# Patient Record
Sex: Female | Born: 1965 | Race: White | Hispanic: No | Smoking: Former smoker
Health system: Southern US, Community
[De-identification: ages and names within clinical notes are randomized; demographics above are authoritative.]

## PROBLEM LIST (undated history)

## (undated) DIAGNOSIS — E782 Mixed hyperlipidemia: Secondary | ICD-10-CM

## (undated) DIAGNOSIS — R350 Frequency of micturition: Secondary | ICD-10-CM

## (undated) DIAGNOSIS — F419 Anxiety disorder, unspecified: Secondary | ICD-10-CM

## (undated) DIAGNOSIS — M1990 Unspecified osteoarthritis, unspecified site: Secondary | ICD-10-CM

## (undated) DIAGNOSIS — E7800 Pure hypercholesterolemia, unspecified: Secondary | ICD-10-CM

## (undated) DIAGNOSIS — K8020 Calculus of gallbladder without cholecystitis without obstruction: Secondary | ICD-10-CM

## (undated) DIAGNOSIS — N390 Urinary tract infection, site not specified: Secondary | ICD-10-CM

## (undated) DIAGNOSIS — M8580 Other specified disorders of bone density and structure, unspecified site: Secondary | ICD-10-CM

## (undated) DIAGNOSIS — E559 Vitamin D deficiency, unspecified: Secondary | ICD-10-CM

## (undated) DIAGNOSIS — K219 Gastro-esophageal reflux disease without esophagitis: Secondary | ICD-10-CM

## (undated) DIAGNOSIS — Z973 Presence of spectacles and contact lenses: Secondary | ICD-10-CM

## (undated) DIAGNOSIS — J189 Pneumonia, unspecified organism: Secondary | ICD-10-CM

## (undated) HISTORY — PX: BREAST ENHANCEMENT SURGERY: SHX7

## (undated) HISTORY — PX: OTHER SURGICAL HISTORY: SHX169

## (undated) HISTORY — PX: CERVICAL ABLATION: SHX5771

## (undated) HISTORY — PX: AUGMENTATION MAMMAPLASTY: SUR837

---

## 2017-05-23 ENCOUNTER — Other Ambulatory Visit: Payer: Self-pay | Admitting: Surgery

## 2017-06-02 NOTE — Progress Notes (Signed)
Anesthesia: SAME DAY WORK-UP.   Patient is a 53 year old female scheduled for laparoscopic cholecystectomy on 06/05/17 by Dr. Coralie Keens. Patient lives 90 minutes away and did not want to come in to PAT. She faxed her last PCP office note from 04/04/17 with Dr. Kyung Rudd at [facility]. History listed includes tobacco user, chronic cholecystitis, mixed HLD, osteopenia, situational anxiety, vitamin D deficiency, recurrent UTI, breast augmentation. Dr. Jeanie Sewer did not recommend statin therapy based on recent cholesterol panel (cardiac risk of 1.6%.) Med list includes Bentyl, Valium PRN.  Per [facility] triage nurse Abigail Butts, Dr. Ninfa Linden did not recommend any specific preoperative labs and would defer to anesthesia. Based on currently available history, she would need a preoperative hemoglobin. One of our PAT RN will complete a phone interview with patient prior to surgery, and I have been asked them to let anesthesia APP know if any pertinent new history of symptoms reported.   George [facility] Short Stay Center/Anesthesiology Phone [phone] 06/02/2017 3:41 PM

## 2017-06-04 ENCOUNTER — Other Ambulatory Visit: Payer: Self-pay

## 2017-06-04 ENCOUNTER — Encounter (HOSPITAL_COMMUNITY): Payer: Self-pay | Admitting: *Deleted

## 2017-06-04 NOTE — H&P (Signed)
Margaret Finley Documented: 05/23/2017 10:02 AM Location: [facility] Patient #: [id number] DOB: [birth year] Divorced / Language: Undefined / Race: Refused to Report/Unreported Female   History of Present Illness (Margaret Finley A. Ninfa Linden MD; 05/23/2017 10:21 AM) The patient is a 52 year old female who presents for evaluation of gall stones. This patient is a self-referral. She has known symptomatic cholelithiasis. She has been having attacks of right upper quadrant abdominal pain with occasional nausea and vomiting for the last 5 years. Her attacks now are becoming more frequent. She's had no jaundice. Bowel movements are normal. The pain is described as sharp and moderate in the right upper quadrant. It occurs after fatty meals. She is otherwise healthy without complaints.   Past Surgical History (Tanisha A. Owens Shark, Lupus; 05/23/2017 10:02 AM) Breast Augmentation  Bilateral.  Diagnostic Studies History (Tanisha A. Owens Shark, [place]; 05/23/2017 10:02 AM) Colonoscopy  5-10 years ago Mammogram  within last year Pap Smear  1-5 years ago  Allergies (Tanisha A. Owens Shark, [place]; 05/23/2017 10:05 AM) Sulfa Antibiotics  Allergies Reconciled   Medication History (Tanisha A. Owens Shark, RMA; 05/23/2017 10:05 AM) DiazePAM (10MG  Tablet, Oral) Active. Ciprofloxacin HCl (500MG  Tablet, Oral) Active. Dicyclomine HCl (20MG  Tablet, Oral) Active. Medications Reconciled  Social History (Tanisha A. Owens Shark, [place]; 05/23/2017 10:02 AM) Alcohol use  Occasional alcohol use. Caffeine use  Carbonated beverages, Coffee. Illicit drug use  Remotely quit drug use. Tobacco use  Former smoker.  Family History (Tanisha A. Owens Shark, [place]; 05/23/2017 10:02 AM) Alcohol Abuse  Father. Arthritis  Father. Diabetes Mellitus  Father. Heart Disease  Father. Heart disease in female family member before age 70  Melanoma  Father.  Pregnancy / Birth History (Tanisha A. Owens Shark, [place]; 05/23/2017 10:02 AM) Age at menarche  49  years. Contraceptive History  Contraceptive implant. Gravida  3 Irregular periods  Length (months) of breastfeeding  3-6 Maternal age  73-25 Para  2  Other Problems (Tanisha A. Owens Shark, [place]; 05/23/2017 10:02 AM) Arthritis  Chest pain  Cholelithiasis  Gastroesophageal Reflux Disease  Hypercholesterolemia  Lump In Breast     Review of Systems (Tanisha A. Brown RMA; 05/23/2017 10:02 AM) General Present- Appetite Loss and Night Sweats. Not Present- Chills, Fatigue, Fever, Weight Gain and Weight Loss. Skin Not Present- Change in Wart/Mole, Dryness, Hives, Jaundice, New Lesions, Non-Healing Wounds, Rash and Ulcer. HEENT Present- Seasonal Allergies and Wears glasses/contact lenses. Not Present- Earache, Hearing Loss, Hoarseness, Nose Bleed, Oral Ulcers, Ringing in the Ears, Sinus Pain, Sore Throat, Visual Disturbances and Yellow Eyes. Respiratory Not Present- Bloody sputum, Chronic Cough, Difficulty Breathing, Snoring and Wheezing. Breast Present- Breast Pain. Not Present- Breast Mass, Nipple Discharge and Skin Changes. Cardiovascular Present- Leg Cramps. Not Present- Chest Pain, Difficulty Breathing Lying Down, Palpitations, Rapid Heart Rate, Shortness of Breath and Swelling of Extremities. Gastrointestinal Present- Abdominal Pain, Nausea and Vomiting. Not Present- Bloating, Bloody Stool, Change in Bowel Habits, Chronic diarrhea, Constipation, Difficulty Swallowing, Excessive gas, Gets full quickly at meals, Hemorrhoids, Indigestion and Rectal Pain. Female Genitourinary Present- Frequency, Nocturia and Urgency. Not Present- Painful Urination and Pelvic Pain. Musculoskeletal Present- Joint Pain and Joint Stiffness. Not Present- Back Pain, Muscle Pain, Muscle Weakness and Swelling of Extremities. Neurological Not Present- Decreased Memory, Fainting, Headaches, Numbness, Seizures, Tingling, Tremor, Trouble walking and Weakness. Psychiatric Not Present- Anxiety, Bipolar, Change in Sleep  Pattern, Depression, Fearful and Frequent crying. Endocrine Present- Hot flashes. Not Present- Cold Intolerance, Excessive Hunger, Hair Changes, Heat Intolerance and New Diabetes. Hematology Not Present- Blood Thinners, Easy Bruising, Excessive  bleeding, Gland problems, HIV and Persistent Infections.  Vitals (Tanisha A. Brown RMA; 05/23/2017 10:04 AM) 05/23/2017 10:03 AM Weight: 142 lb Height: 65in Body Surface Area: 1.71 m Body Mass Index: 23.63 kg/m  Temp.: 98.12F  Pulse: 74 (Regular)  BP: 126/88 (Sitting, Left Arm, Standard)       Physical Exam (Wrenna Saks A. Ninfa Linden MD; 05/23/2017 10:21 AM) The physical exam findings are as follows: Note:Today she is well and comfortable in appearance There is no jaundice Lungs are clear bilaterally Cardiovascular regular rate and rhythm Abdomen is soft and nontender    Assessment & Plan (Jonnae Fonseca A. Ninfa Linden MD; 05/23/2017 10:22 AM) SYMPTOMATIC CHOLELITHIASIS (K80.20) Impression: I have reviewed her ultrasound showing cholelithiasis. The bile ducts is normal in size. We discussed cholecystectomy in detail. I gave him literature regarding surgery. We discussed the risk of surgery which includes but is not limited to bleeding, infection, bile duct injury, bile leak, the need to convert to an open procedure, cardiopulmonary issues, postoperative recovery, etc. She understands and wished to proceed with surgery which will be scheduled

## 2017-06-04 NOTE — Anesthesia Preprocedure Evaluation (Addendum)
Anesthesia Evaluation  Patient identified by MRN, date of birth, ID band Patient awake    Reviewed: Allergy & Precautions, H&P , NPO status , Patient's Chart, lab work & pertinent test results  Airway Mallampati: I  TM Distance: >3 FB Neck ROM: Full    Dental no notable dental hx. (+) Teeth Intact, Dental Advisory Given   Pulmonary neg pulmonary ROS, former smoker,    Pulmonary exam normal breath sounds clear to auscultation       Cardiovascular Exercise Tolerance: Good negative cardio ROS   Rhythm:Regular Rate:Normal     Neuro/Psych Anxiety negative neurological ROS  negative psych ROS   GI/Hepatic negative GI ROS, Neg liver ROS,   Endo/Other  negative endocrine ROS  Renal/GU negative Renal ROS  negative genitourinary   Musculoskeletal  (+) Arthritis , Osteoarthritis,    Abdominal   Peds  Hematology negative hematology ROS (+)   Anesthesia Other Findings   Reproductive/Obstetrics negative OB ROS                            Anesthesia Physical Anesthesia Plan  ASA: II  Anesthesia Plan: General   Post-op Pain Management:    Induction: Intravenous  PONV Risk Score and Plan: 4 or greater and Ondansetron, Dexamethasone and Midazolam  Airway Management Planned: Oral ETT  Additional Equipment:   Intra-op Plan:   Post-operative Plan: Extubation in OR  Informed Consent: I have reviewed the patients History and Physical, chart, labs and discussed the procedure including the risks, benefits and alternatives for the proposed anesthesia with the patient or authorized representative who has indicated his/her understanding and acceptance.   Dental advisory given  Plan Discussed with: CRNA  Anesthesia Plan Comments:         Anesthesia Quick Evaluation

## 2017-06-04 NOTE — Progress Notes (Signed)
Pt denies SOB, chest pain, and being under the care of a cardiologist. Pt denies having an  echo and cardiac cath but stated that a stress test was performed 25 years ago. Pt denies having an EKG and chest x ray within the last year. Pt denies recent labs. Pt made aware to stop taking Aspirin, vitamins, fish oil, Red Yeast Rice and herbal medications. Do not take any NSAIDs ie: Ibuprofen, Advil, Naproxen (Aleve), Motrin, BC and Goody Powder. Pt verbalized understanding of all pre-op instructions. See anesthesia note.

## 2017-06-05 ENCOUNTER — Encounter (HOSPITAL_COMMUNITY): Payer: Self-pay | Admitting: Certified Registered Nurse Anesthetist

## 2017-06-05 ENCOUNTER — Ambulatory Visit (HOSPITAL_COMMUNITY): Payer: PRIVATE HEALTH INSURANCE | Admitting: Vascular Surgery

## 2017-06-05 ENCOUNTER — Encounter (HOSPITAL_COMMUNITY)
Admission: RE | Disposition: A | Discharge status: Home / Self Care | Payer: Self-pay | Source: Ambulatory Visit | Attending: Surgery

## 2017-06-05 ENCOUNTER — Ambulatory Visit (HOSPITAL_COMMUNITY)
Admission: RE | Admit: 2017-06-05 | Discharge: 2017-06-05 | Disposition: A | Discharge status: Home / Self Care | Payer: PRIVATE HEALTH INSURANCE | Source: Ambulatory Visit | Attending: Surgery | Admitting: Surgery

## 2017-06-05 DIAGNOSIS — K8010 Calculus of gallbladder with chronic cholecystitis without obstruction: Secondary | ICD-10-CM | POA: Insufficient documentation

## 2017-06-05 DIAGNOSIS — D171 Benign lipomatous neoplasm of skin and subcutaneous tissue of trunk: Secondary | ICD-10-CM | POA: Diagnosis not present

## 2017-06-05 DIAGNOSIS — Z87891 Personal history of nicotine dependence: Secondary | ICD-10-CM | POA: Diagnosis not present

## 2017-06-05 DIAGNOSIS — Z79899 Other long term (current) drug therapy: Secondary | ICD-10-CM | POA: Diagnosis not present

## 2017-06-05 DIAGNOSIS — K8020 Calculus of gallbladder without cholecystitis without obstruction: Secondary | ICD-10-CM | POA: Diagnosis present

## 2017-06-05 HISTORY — DX: Calculus of gallbladder without cholecystitis without obstruction: K80.20

## 2017-06-05 HISTORY — DX: Urinary tract infection, site not specified: N39.0

## 2017-06-05 HISTORY — PX: LIPOMA EXCISION: SHX5283

## 2017-06-05 HISTORY — DX: Gastro-esophageal reflux disease without esophagitis: K21.9

## 2017-06-05 HISTORY — DX: Vitamin D deficiency, unspecified: E55.9

## 2017-06-05 HISTORY — PX: CHOLECYSTECTOMY: SHX55

## 2017-06-05 HISTORY — DX: Pure hypercholesterolemia, unspecified: E78.00

## 2017-06-05 HISTORY — DX: Presence of spectacles and contact lenses: Z97.3

## 2017-06-05 HISTORY — DX: Unspecified osteoarthritis, unspecified site: M19.90

## 2017-06-05 HISTORY — DX: Other specified disorders of bone density and structure, unspecified site: M85.80

## 2017-06-05 HISTORY — DX: Pneumonia, unspecified organism: J18.9

## 2017-06-05 HISTORY — DX: Anxiety disorder, unspecified: F41.9

## 2017-06-05 HISTORY — DX: Frequency of micturition: R35.0

## 2017-06-05 HISTORY — DX: Mixed hyperlipidemia: E78.2

## 2017-06-05 SURGERY — LAPAROSCOPIC CHOLECYSTECTOMY
Anesthesia: General | Site: Shoulder | Laterality: Right

## 2017-06-05 MED ORDER — 0.9 % SODIUM CHLORIDE (POUR BTL) OPTIME
TOPICAL | Status: DC | PRN
  Administered 2017-06-05: 1000 mL

## 2017-06-05 MED ORDER — LIDOCAINE 2% (20 MG/ML) 5 ML SYRINGE
INTRAMUSCULAR | Status: DC | PRN
  Administered 2017-06-05: 100 mg via INTRAVENOUS

## 2017-06-05 MED ORDER — GLYCOPYRROLATE 0.2 MG/ML IJ SOLN
INTRAMUSCULAR | Status: DC | PRN
  Administered 2017-06-05: .6 mg via INTRAVENOUS

## 2017-06-05 MED ORDER — LACTATED RINGERS IV SOLN
INTRAVENOUS | Status: DC | PRN
  Administered 2017-06-05: 07:00:00 via INTRAVENOUS

## 2017-06-05 MED ORDER — PROPOFOL 10 MG/ML IV BOLUS
INTRAVENOUS | Status: AC
  Filled 2017-06-05: qty 20

## 2017-06-05 MED ORDER — FENTANYL CITRATE (PF) 250 MCG/5ML IJ SOLN
INTRAMUSCULAR | Status: AC
  Filled 2017-06-05: qty 5

## 2017-06-05 MED ORDER — FENTANYL CITRATE (PF) 250 MCG/5ML IJ SOLN
INTRAMUSCULAR | Status: DC | PRN
  Administered 2017-06-05: 150 ug via INTRAVENOUS
  Administered 2017-06-05: 50 ug via INTRAVENOUS

## 2017-06-05 MED ORDER — ROCURONIUM BROMIDE 10 MG/ML (PF) SYRINGE
PREFILLED_SYRINGE | INTRAVENOUS | Status: AC
  Filled 2017-06-05: qty 5

## 2017-06-05 MED ORDER — CHLORHEXIDINE GLUCONATE CLOTH 2 % EX PADS: 6.0000 | MEDICATED_PAD | Freq: Once | CUTANEOUS | Status: DC

## 2017-06-05 MED ORDER — ONDANSETRON HCL 4 MG/2ML IJ SOLN
INTRAMUSCULAR | Status: DC | PRN
  Administered 2017-06-05: 4 mg via INTRAVENOUS

## 2017-06-05 MED ORDER — CELECOXIB 200 MG PO CAPS
200.0000 mg | ORAL_CAPSULE | ORAL | Status: AC
  Administered 2017-06-05: 200 mg via ORAL
  Filled 2017-06-05: qty 1

## 2017-06-05 MED ORDER — MIDAZOLAM HCL 2 MG/2ML IJ SOLN
INTRAMUSCULAR | Status: AC
  Filled 2017-06-05: qty 2

## 2017-06-05 MED ORDER — GABAPENTIN 300 MG PO CAPS
300.0000 mg | ORAL_CAPSULE | ORAL | Status: AC
  Administered 2017-06-05: 300 mg via ORAL
  Filled 2017-06-05: qty 1

## 2017-06-05 MED ORDER — BUPIVACAINE-EPINEPHRINE (PF) 0.25% -1:200000 IJ SOLN
INTRAMUSCULAR | Status: AC
  Filled 2017-06-05: qty 30

## 2017-06-05 MED ORDER — CEFAZOLIN SODIUM-DEXTROSE 2-4 GM/100ML-% IV SOLN
2.0000 g | INTRAVENOUS | Status: AC
  Administered 2017-06-05: 2 g via INTRAVENOUS
  Filled 2017-06-05: qty 100

## 2017-06-05 MED ORDER — ONDANSETRON HCL 4 MG/2ML IJ SOLN
INTRAMUSCULAR | Status: AC
  Filled 2017-06-05: qty 2

## 2017-06-05 MED ORDER — BUPIVACAINE-EPINEPHRINE 0.25% -1:200000 IJ SOLN
INTRAMUSCULAR | Status: DC | PRN
  Administered 2017-06-05: 30 mL

## 2017-06-05 MED ORDER — NEOSTIGMINE METHYLSULFATE 5 MG/5ML IV SOSY
PREFILLED_SYRINGE | INTRAVENOUS | Status: AC
  Filled 2017-06-05: qty 5

## 2017-06-05 MED ORDER — DEXAMETHASONE SODIUM PHOSPHATE 10 MG/ML IJ SOLN
INTRAMUSCULAR | Status: DC | PRN
  Administered 2017-06-05: 4 mg via INTRAVENOUS

## 2017-06-05 MED ORDER — ACETAMINOPHEN 500 MG PO TABS
1000.0000 mg | ORAL_TABLET | ORAL | Status: AC
  Administered 2017-06-05: 1000 mg via ORAL
  Filled 2017-06-05: qty 2

## 2017-06-05 MED ORDER — ROCURONIUM BROMIDE 10 MG/ML (PF) SYRINGE
PREFILLED_SYRINGE | INTRAVENOUS | Status: DC | PRN
  Administered 2017-06-05: 50 mg via INTRAVENOUS

## 2017-06-05 MED ORDER — PROPOFOL 10 MG/ML IV BOLUS
INTRAVENOUS | Status: DC | PRN
  Administered 2017-06-05: 150 mg via INTRAVENOUS

## 2017-06-05 MED ORDER — SODIUM CHLORIDE 0.9 % IR SOLN
Status: DC | PRN
  Administered 2017-06-05: 1000 mL

## 2017-06-05 MED ORDER — LIDOCAINE HCL (CARDIAC) 20 MG/ML IV SOLN
INTRAVENOUS | Status: AC
  Filled 2017-06-05: qty 10

## 2017-06-05 MED ORDER — NEOSTIGMINE METHYLSULFATE 10 MG/10ML IV SOLN
INTRAVENOUS | Status: DC | PRN
  Administered 2017-06-05: 4 mg via INTRAVENOUS
  Administered 2017-06-05: 1 mg via INTRAVENOUS

## 2017-06-05 MED ORDER — OXYCODONE HCL 5 MG PO TABS: 5.0000 mg | ORAL_TABLET | Freq: Four times a day (QID) | ORAL | 0 refills | Status: AC | PRN

## 2017-06-05 MED ORDER — DEXAMETHASONE SODIUM PHOSPHATE 10 MG/ML IJ SOLN
INTRAMUSCULAR | Status: AC
  Filled 2017-06-05: qty 1

## 2017-06-05 MED ORDER — MIDAZOLAM HCL 2 MG/2ML IJ SOLN
INTRAMUSCULAR | Status: DC | PRN
  Administered 2017-06-05: 2 mg via INTRAVENOUS

## 2017-06-05 SURGICAL SUPPLY — 49 items
APPLIER CLIP 5 13 M/L LIGAMAX5 (MISCELLANEOUS) ×4
BLADE SURG 15 STRL LF DISP TIS (BLADE) ×2 IMPLANT
BLADE SURG 15 STRL SS (BLADE) ×2
CANISTER SUCT 3000ML PPV (MISCELLANEOUS) ×4 IMPLANT
CHLORAPREP W/TINT 26ML (MISCELLANEOUS) ×4 IMPLANT
CLIP APPLIE 5 13 M/L LIGAMAX5 (MISCELLANEOUS) ×2 IMPLANT
COVER SURGICAL LIGHT HANDLE (MISCELLANEOUS) ×4 IMPLANT
DERMABOND ADVANCED (GAUZE/BANDAGES/DRESSINGS) ×4
DERMABOND ADVANCED .7 DNX12 (GAUZE/BANDAGES/DRESSINGS) ×4 IMPLANT
ELECT CAUTERY BLADE 6.4 (BLADE) ×4 IMPLANT
ELECT REM PT RETURN 9FT ADLT (ELECTROSURGICAL) ×4
ELECTRODE REM PT RTRN 9FT ADLT (ELECTROSURGICAL) ×2 IMPLANT
GLOVE BIOGEL PI IND STRL 6.5 (GLOVE) ×2 IMPLANT
GLOVE BIOGEL PI IND STRL 7.0 (GLOVE) ×4 IMPLANT
GLOVE BIOGEL PI IND STRL 7.5 (GLOVE) ×2 IMPLANT
GLOVE BIOGEL PI IND STRL 8 (GLOVE) ×2 IMPLANT
GLOVE BIOGEL PI INDICATOR 6.5 (GLOVE) ×2
GLOVE BIOGEL PI INDICATOR 7.0 (GLOVE) ×4
GLOVE BIOGEL PI INDICATOR 7.5 (GLOVE) ×2
GLOVE BIOGEL PI INDICATOR 8 (GLOVE) ×2
GLOVE SURG SIGNA 7.5 PF LTX (GLOVE) IMPLANT
GLOVE SURG SS PI 6.5 STRL IVOR (GLOVE) ×4 IMPLANT
GLOVE SURG SS PI 7.0 STRL IVOR (GLOVE) ×4 IMPLANT
GLOVE SURG SS PI 7.5 STRL IVOR (GLOVE) ×4 IMPLANT
GOWN STRL REUS W/ TWL LRG LVL3 (GOWN DISPOSABLE) ×6 IMPLANT
GOWN STRL REUS W/ TWL XL LVL3 (GOWN DISPOSABLE) ×2 IMPLANT
GOWN STRL REUS W/TWL LRG LVL3 (GOWN DISPOSABLE) ×6
GOWN STRL REUS W/TWL XL LVL3 (GOWN DISPOSABLE) ×2
KIT BASIN OR (CUSTOM PROCEDURE TRAY) ×4 IMPLANT
KIT TURNOVER KIT B (KITS) ×4 IMPLANT
NS IRRIG 1000ML POUR BTL (IV SOLUTION) ×4 IMPLANT
PAD ARMBOARD 7.5X6 YLW CONV (MISCELLANEOUS) ×4 IMPLANT
PENCIL BUTTON HOLSTER BLD 10FT (ELECTRODE) ×4 IMPLANT
POUCH SPECIMEN RETRIEVAL 10MM (ENDOMECHANICALS) ×4 IMPLANT
SCISSORS LAP 5X35 DISP (ENDOMECHANICALS) ×4 IMPLANT
SET IRRIG TUBING LAPAROSCOPIC (IRRIGATION / IRRIGATOR) ×4 IMPLANT
SLEEVE ENDOPATH XCEL 5M (ENDOMECHANICALS) ×8 IMPLANT
SOLUTION BETADINE 4OZ (MISCELLANEOUS) ×4 IMPLANT
SPECIMEN JAR SMALL (MISCELLANEOUS) ×4 IMPLANT
SUT MNCRL AB 4-0 PS2 18 (SUTURE) ×8 IMPLANT
SUT VIC AB 3-0 SH 27 (SUTURE) ×2
SUT VIC AB 3-0 SH 27X BRD (SUTURE) ×2 IMPLANT
TOWEL OR 17X24 6PK STRL BLUE (TOWEL DISPOSABLE) ×4 IMPLANT
TOWEL OR 17X26 10 PK STRL BLUE (TOWEL DISPOSABLE) ×4 IMPLANT
TRAY LAPAROSCOPIC MC (CUSTOM PROCEDURE TRAY) ×4 IMPLANT
TROCAR XCEL BLUNT TIP 100MML (ENDOMECHANICALS) ×4 IMPLANT
TROCAR XCEL NON-BLD 5MMX100MML (ENDOMECHANICALS) ×4 IMPLANT
TUBING INSUFFLATION (TUBING) ×4 IMPLANT
WATER STERILE IRR 1000ML POUR (IV SOLUTION) ×4 IMPLANT

## 2017-06-05 NOTE — Op Note (Signed)
Laparoscopic Cholecystectomy, excision 1.5 cm right shoulder mass  Procedure Note  Indications: This patient presents with symptomatic gallbladder disease and will undergo laparoscopic cholecystectomy.  Pre-operative Diagnosis: symptomatic cholelithiasis, 1.5 cm right shoulder mass  Post-operative Diagnosis: same  Surgeon: Coralie Keens A   Assistants:   Anesthesia: General endotracheal anesthesia  ASA Class: 2  Procedure Details  The patient was seen again in the Holding Room. The risks, benefits, complications, treatment options, and expected outcomes were discussed with the patient. The possibilities of reaction to medication, pulmonary aspiration, perforation of viscus, bleeding, recurrent infection, finding a normal gallbladder, the need for additional procedures, failure to diagnose a condition, the possible need to convert to an open procedure, and creating a complication requiring transfusion or operation were discussed with the patient. The likelihood of improving the patient's symptoms with return to their baseline status is good.  The patient and/or family concurred with the proposed plan, giving informed consent. The site of surgery properly noted. The patient was taken to Operating Room, identified as Conan Bowens and the procedure verified as Laparoscopic Cholecystectomy with Intraoperative Cholangiogram. A Time Out was held and the above information confirmed.  Prior to the induction of general anesthesia, antibiotic prophylaxis was administered. General endotracheal anesthesia was then administered and tolerated well. After the induction, the abdomen was prepped with Chloraprep and draped in sterile fashion. The patient was positioned in the supine position.  Local anesthetic agent was injected into the skin near the umbilicus and an incision made. We dissected down to the abdominal fascia with blunt dissection.  The fascia was incised vertically and we entered the peritoneal  cavity bluntly.  A pursestring suture of 0-Vicryl was placed around the fascial opening.  The Hasson cannula was inserted and secured with the stay suture.  Pneumoperitoneum was then created with CO2 and tolerated well without any adverse changes in the patient's vital signs. A 5-mm port was placed in the subxiphoid position.  Two 5-mm ports were placed in the right upper quadrant. All skin incisions were infiltrated with a local anesthetic agent before making the incision and placing the trocars.   We positioned the patient in reverse Trendelenburg, tilted slightly to the patient's left.  The gallbladder was identified, the fundus grasped and retracted cephalad. Adhesions were lysed bluntly and with the electrocautery where indicated, taking care not to injure any adjacent organs or viscus. The infundibulum was grasped and retracted laterally, exposing the peritoneum overlying the triangle of Calot. This was then divided and exposed in a blunt fashion. The cystic duct was clearly identified and bluntly dissected circumferentially. A critical view of the cystic duct and cystic artery was obtained.  The cystic duct was then ligated with clips and divided. The cystic artery was, dissected free, ligated with clips and divided as well.   The gallbladder was dissected from the liver bed in retrograde fashion with the electrocautery. The gallbladder was removed and placed in an Endocatch sac. The liver bed was irrigated and inspected. Hemostasis was achieved with the electrocautery. Copious irrigation was utilized and was repeatedly aspirated until clear.  The gallbladder and Endocatch sac were then removed through the umbilical port site.  The pursestring suture was used to close the umbilical fascia.    We again inspected the right upper quadrant for hemostasis.  Pneumoperitoneum was released as we removed the trocars.  4-0 Monocryl was used to close the skin.   Dermabond was then applied.   We then prepped and  draped her right shoulder.  I anesthetized the skin of the previous scar at the shoulder mass with Marcaine.  I made an incision with a scalpel.  I then excised the small mass in its entirety.  It was consistent with a lipoma.  I then closed the subtenons tissue with interrupted 3-0 Vicryl sutures and closed the skin with a running 4-0 Monocryl.  Dermabond was then applied.  The patient was then extubated and brought to the recovery room in stable condition. Instrument, sponge, and needle counts were correct at closure and at the conclusion of the case.   Findings: Chronic Cholecystitis with Cholelithiasis Right shoulder 1.5 cm lipoma  Estimated Blood Loss: Minimal         Drains: 0         Specimens: Gallbladder           Complications: None; patient tolerated the procedure well.         Disposition: PACU - hemodynamically stable.         Condition: stable

## 2017-06-05 NOTE — Anesthesia Postprocedure Evaluation (Signed)
Anesthesia Post Note  Patient: Margaret Finley  Procedure(s) Performed: LAPAROSCOPIC CHOLECYSTECTOMY ERAS PATHWAY (N/A Abdomen) EXCISION LIPOMA RIGHT SHOULDER (Right Shoulder)     Patient location during evaluation: PACU Anesthesia Type: General Level of consciousness: awake and alert Pain management: pain level controlled Vital Signs Assessment: post-procedure vital signs reviewed and stable Respiratory status: spontaneous breathing, nonlabored ventilation and respiratory function stable Cardiovascular status: blood pressure returned to baseline and stable Postop Assessment: no apparent nausea or vomiting Anesthetic complications: no    Last Vitals:  Vitals:   06/05/17 0904 06/05/17 0915  BP:  (!) 135/95  Pulse: 63 63  Resp: 16 16  Temp: 36.7 C   SpO2: 96% 98%    Last Pain:  Vitals:   06/05/17 0915  TempSrc:   PainSc: 0-No pain                 Lexy Meininger,W. EDMOND

## 2017-06-05 NOTE — Transfer of Care (Signed)
Immediate Anesthesia Transfer of Care Note  Patient: Margaret Finley  Procedure(s) Performed: LAPAROSCOPIC CHOLECYSTECTOMY ERAS PATHWAY (N/A Abdomen) EXCISION LIPOMA RIGHT SHOULDER (Right Shoulder)  Patient Location: PACU  Anesthesia Type:General  Level of Consciousness: awake, alert , oriented and patient cooperative  Airway & Oxygen Therapy: Patient Spontanous Breathing  Post-op Assessment: Report given to RN and Post -op Vital signs reviewed and stable  Post vital signs: Reviewed and stable  Last Vitals:  Vitals Value Taken Time  BP 127/58 06/05/2017  8:38 AM  Temp    Pulse 84 06/05/2017  8:38 AM  Resp 24 06/05/2017  8:38 AM  SpO2 97 % 06/05/2017  8:38 AM  Vitals shown include unvalidated device data.  Last Pain:  Vitals:   06/05/17 0711  TempSrc:   PainSc: 0-No pain      Patients Stated Pain Goal: 2 (12/25/49 0258)  Complications: No apparent anesthesia complications

## 2017-06-05 NOTE — Discharge Instructions (Signed)
[facility] ______[facility] °LAPAROSCOPIC SURGERY: POST OP INSTRUCTIONS °Always review your discharge instruction sheet given to you by the facility where your surgery was performed. °IF YOU HAVE DISABILITY OR FAMILY LEAVE FORMS, YOU MUST BRING THEM TO THE OFFICE FOR PROCESSING.   °DO NOT GIVE THEM TO YOUR DOCTOR. ° °1. A prescription for pain medication may be given to you upon discharge.  Take your pain medication as prescribed, if needed.  If narcotic pain medicine is not needed, then you may take acetaminophen (Tylenol) or ibuprofen (Advil) as needed. °2. Take your usually prescribed medications unless otherwise directed. °3. If you need a refill on your pain medication, please contact your pharmacy.  They will contact our office to request authorization. Prescriptions will not be filled after 5pm or on week-ends. °4. You should follow a light diet the first few days after arrival home, such as soup and crackers, etc.  Be sure to include lots of fluids daily. °5. Most patients will experience some swelling and bruising in the area of the incisions.  Ice packs will help.  Swelling and bruising can take several days to resolve.  °6. It is common to experience some constipation if taking pain medication after surgery.  Increasing fluid intake and taking a stool softener (such as Colace) will usually help or prevent this problem from occurring.  A mild laxative (Milk of Magnesia or Miralax) should be taken according to package instructions if there are no bowel movements after 48 hours. °7. Unless discharge instructions indicate otherwise, you may remove your bandages 24-48 hours after surgery, and you may shower at that time.  You may have steri-strips (small skin tapes) in place directly over the incision.  These strips should be left on the skin for 7-10 days.  If your surgeon used skin glue on the incision, you may shower in 24 hours.  The glue will flake off over the next 2-3 weeks.  Any sutures or  staples will be removed at the office during your follow-up visit. °8. ACTIVITIES:  You may resume regular (light) daily activities beginning the next day--such as daily self-care, walking, climbing stairs--gradually increasing activities as tolerated.  You may have sexual intercourse when it is comfortable.  Refrain from any heavy lifting or straining until approved by your doctor. °a. You may drive when you are no longer taking prescription pain medication, you can comfortably wear a seatbelt, and you can safely maneuver your car and apply brakes. °b. RETURN TO WORK:  __________________________________________________________ °9. You should see your doctor in the office for a follow-up appointment approximately 2-3 weeks after your surgery.  Make sure that you call for this appointment within a day or two after you arrive home to insure a convenient appointment time. °10. OTHER INSTRUCTIONS:OK TO SHOWER STARTING TOMORROW °11. ICE PACK, TYLENOL, IBUPROFEN ALSO FOR PAIN °12. NO LIFTING MORE THAN 15 TO 20 POUNDS FOR 2 WEEKS __________________________________________________________________________________________________________________________ __________________________________________________________________________________________________________________________ °WHEN TO CALL YOUR DOCTOR: °1. Fever over 101.0 °2. Inability to urinate °3. Continued bleeding from incision. °4. Increased pain, redness, or drainage from the incision. °5. Increasing abdominal pain ° °The clinic staff is available to answer your questions during regular business hours.  Please don’t hesitate to call and ask to speak to one of the nurses for clinical concerns.  If you have a medical emergency, go to the nearest emergency room or call 911.  A surgeon from [facility] is always on call at the hospital. °[address], [address], [place], [place]  [address] ? [address] [address], [place], [place]   [address] °[phone] ?  [phone] ? FAX [phone] °Web site: [email or web address] °

## 2017-06-05 NOTE — Anesthesia Procedure Notes (Signed)
Procedure Name: Intubation Date/Time: 06/05/2017 7:35 AM Performed by: White, Amedeo Plenty, CRNA Pre-anesthesia Checklist: Patient identified, Emergency Drugs available, Suction available and Patient being monitored Patient Re-evaluated:Patient Re-evaluated prior to induction Oxygen Delivery Method: Circle System Utilized Preoxygenation: Pre-oxygenation with 100% oxygen Induction Type: IV induction Ventilation: Mask ventilation without difficulty Laryngoscope Size: Mac and 3 Grade View: Grade I Tube type: Oral Tube size: 7.0 mm Number of attempts: 1 Airway Equipment and Method: Stylet Placement Confirmation: ETT inserted through vocal cords under direct vision,  positive ETCO2 and breath sounds checked- equal and bilateral Secured at: 22 cm Tube secured with: Tape Dental Injury: Teeth and Oropharynx as per pre-operative assessment

## 2017-06-05 NOTE — Interval H&P Note (Signed)
History and Physical Interval Note:no change in H and P except that the patient has a small, recurrent, 1.5 cm right shoulder mass that she would like removed as well.  On exam, it looks consistent with a small lipoma or sebaceous cyst.  I discussed the risks.  Will add to the consent.  06/05/2017 6:41 AM  Margaret Finley  has presented today for surgery, with the diagnosis of Symptomatic gallstones  The various methods of treatment have been discussed with the patient and family. After consideration of risks, benefits and other options for treatment, the patient has consented to  Procedure(s): [place] (N/A) as a surgical intervention .  The patient's history has been reviewed, patient examined, no change in status, stable for surgery.  I have reviewed the patient's chart and labs.  Questions were answered to the patient's satisfaction.     Felina Tello A

## 2017-06-06 ENCOUNTER — Encounter (HOSPITAL_COMMUNITY): Payer: Self-pay | Admitting: Surgery

## 2018-08-11 ENCOUNTER — Other Ambulatory Visit: Payer: Self-pay | Admitting: Obstetrics and Gynecology

## 2018-08-11 DIAGNOSIS — Z1231 Encounter for screening mammogram for malignant neoplasm of breast: Secondary | ICD-10-CM

## 2018-10-02 ENCOUNTER — Other Ambulatory Visit: Payer: Self-pay | Admitting: Obstetrics and Gynecology

## 2018-10-02 ENCOUNTER — Other Ambulatory Visit: Payer: Self-pay

## 2018-10-02 ENCOUNTER — Ambulatory Visit
Admission: RE | Admit: 2018-10-02 | Discharge: 2018-10-02 | Disposition: A | Discharge status: Home / Self Care | Payer: 59 | Source: Ambulatory Visit | Attending: Obstetrics and Gynecology | Admitting: Obstetrics and Gynecology

## 2018-10-02 DIAGNOSIS — Z1231 Encounter for screening mammogram for malignant neoplasm of breast: Secondary | ICD-10-CM

## 2018-10-02 IMAGING — MG DIGITAL SCREENING BILATERAL MAMMOGRAM WITH IMPLANTS, CAD AND TOM
8 of 12 series · 8 of 28 positions shown · non-contrast
Comparison: Previous exam(s).

CLINICAL DATA: Screening.

EXAM:
DIGITAL SCREENING BILATERAL MAMMOGRAM WITH IMPLANTS, CAD AND TOMO
The patient has retropectoral implants. Standard and implant
displaced views were performed.

[R MLO]
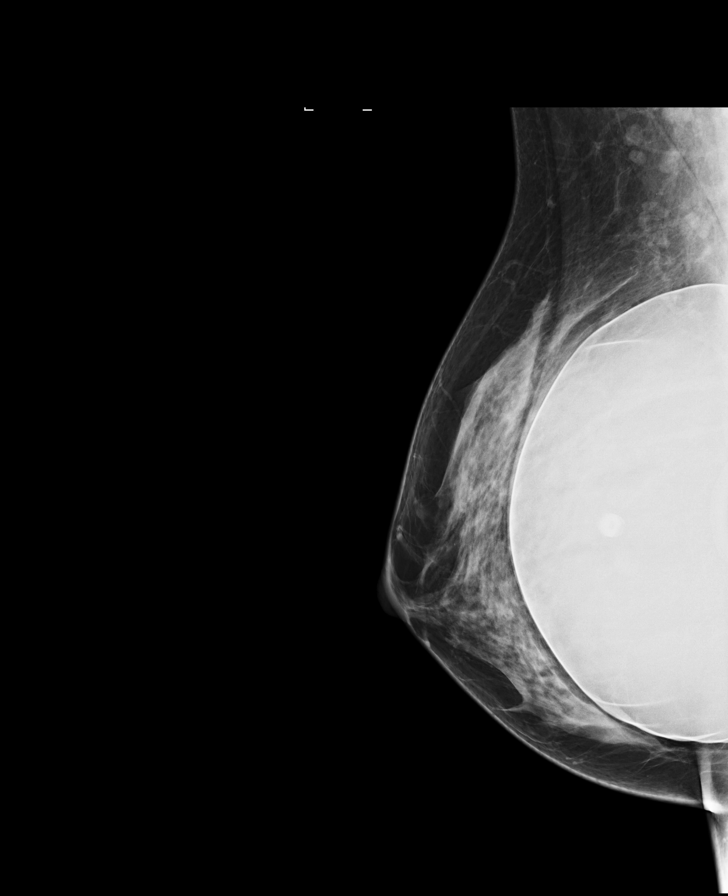

[R CC]
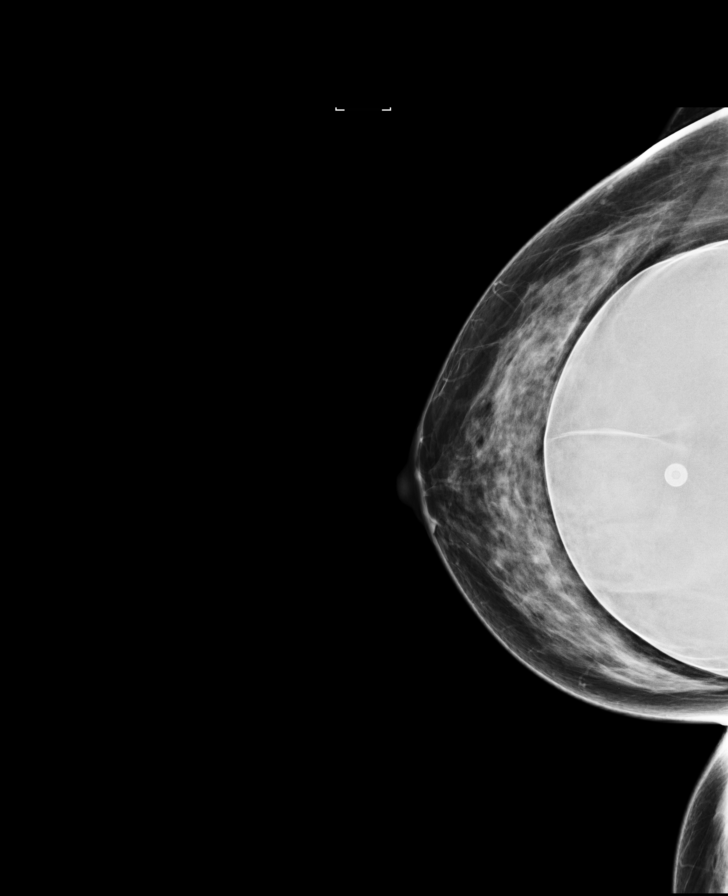

[L CC]
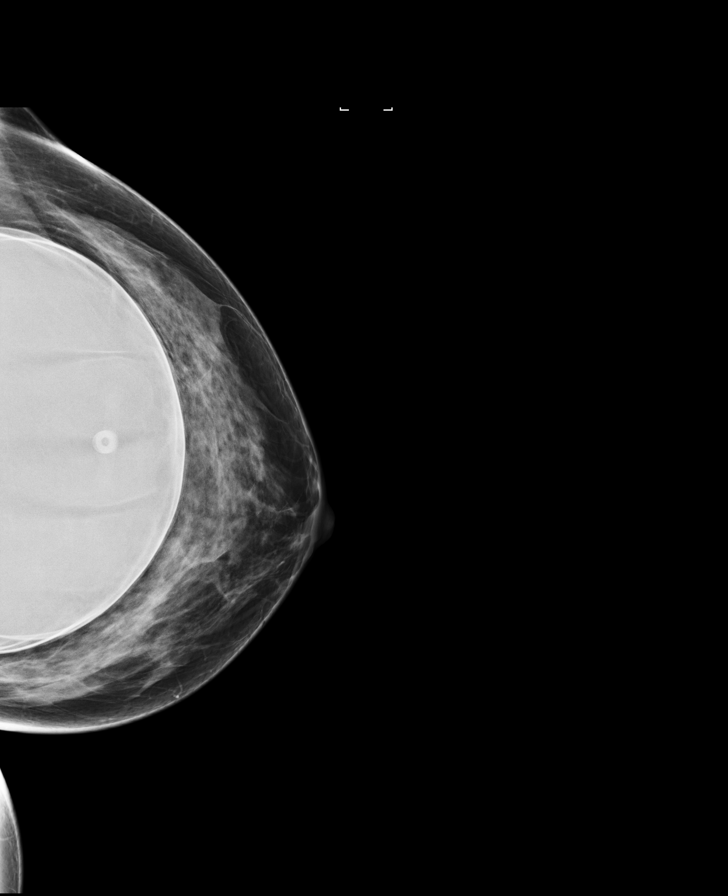

[L MLO]
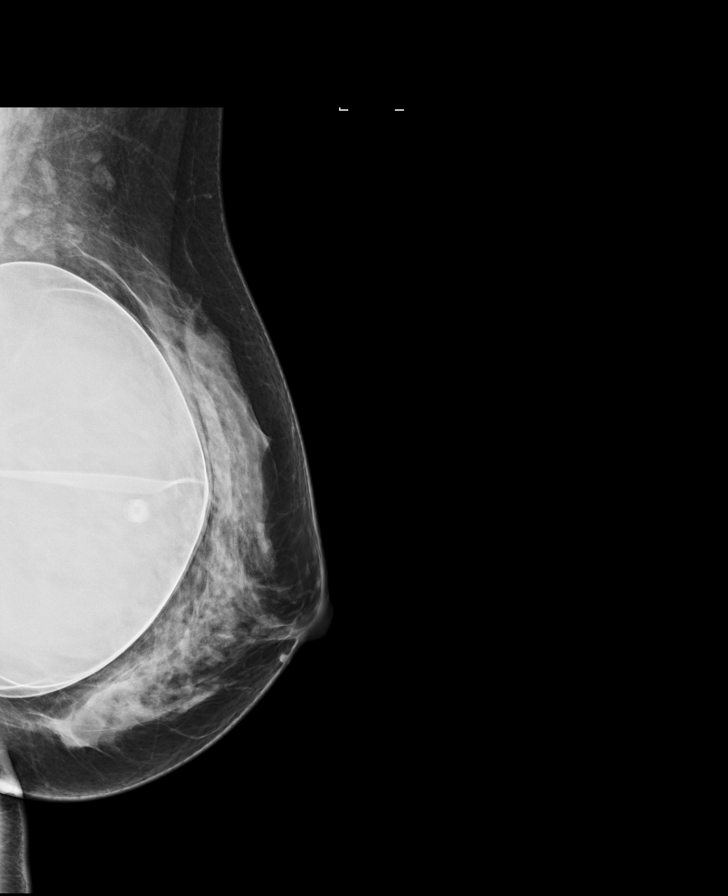

[R MLO synth-2D]
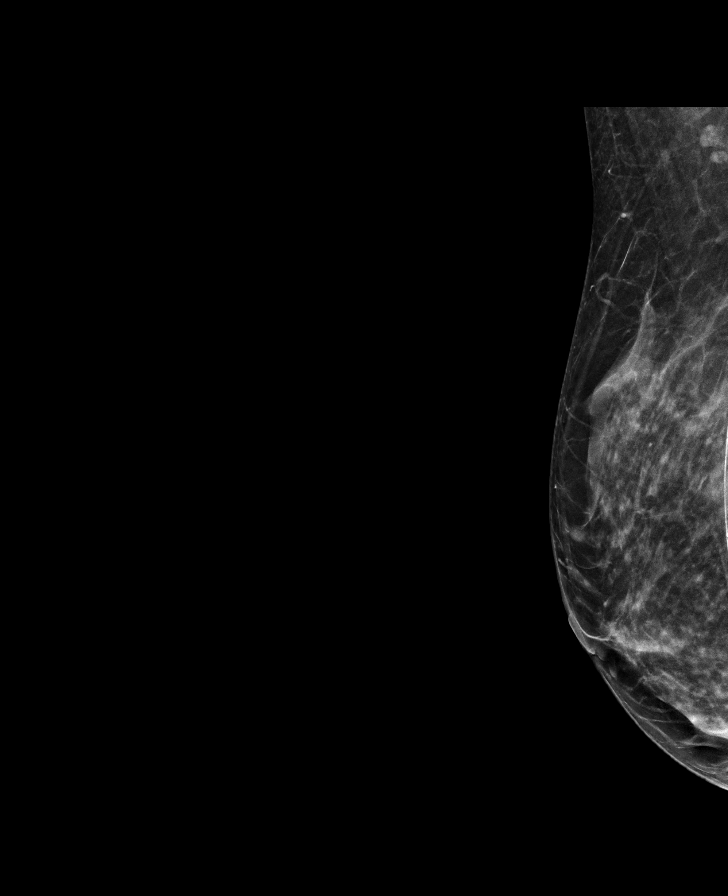

[L MLO synth-2D]
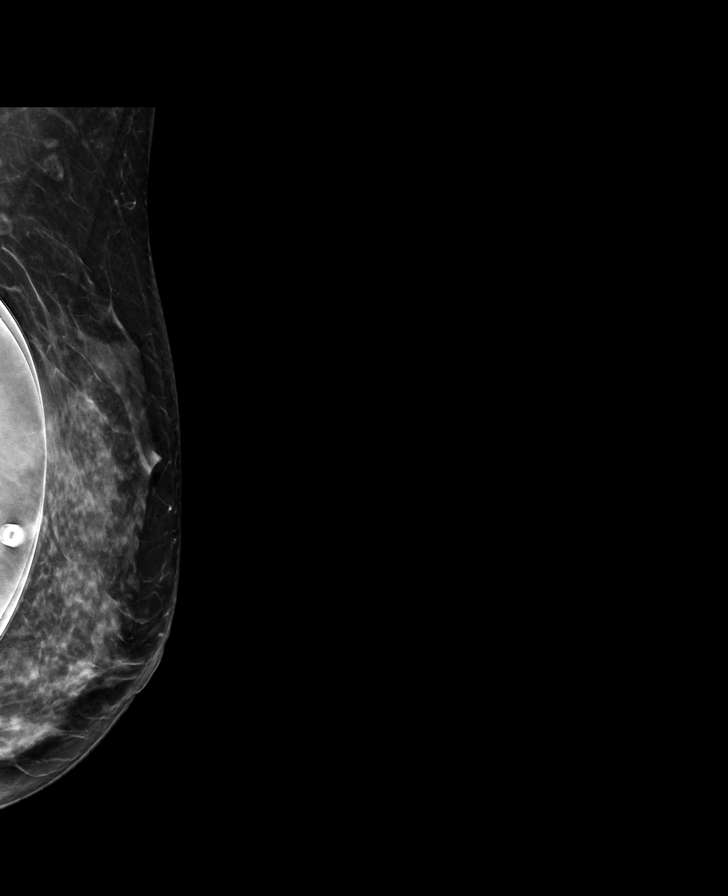

[R CC synth-2D]
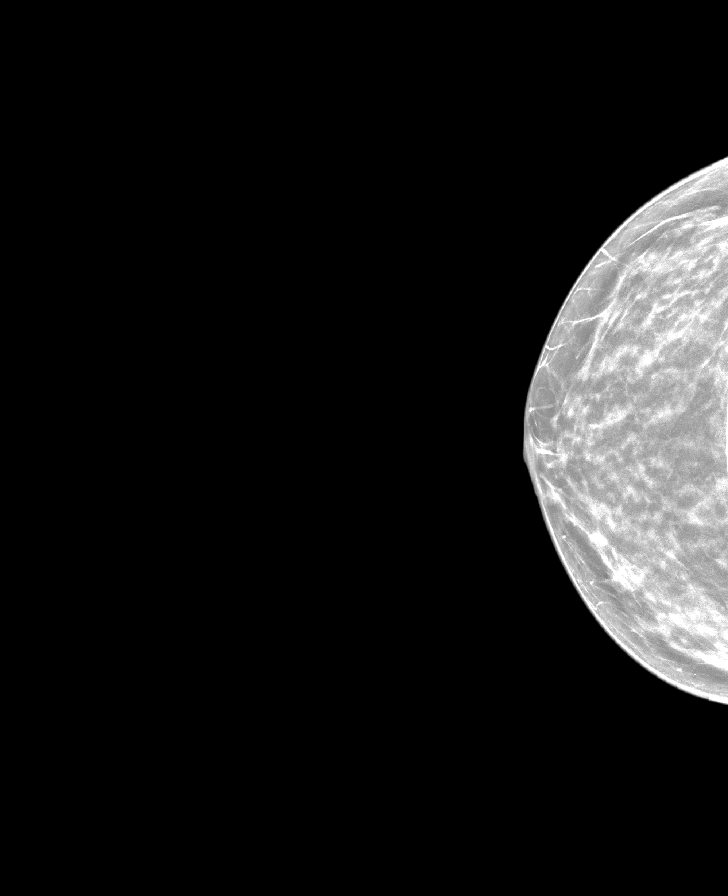

[L CC synth-2D]
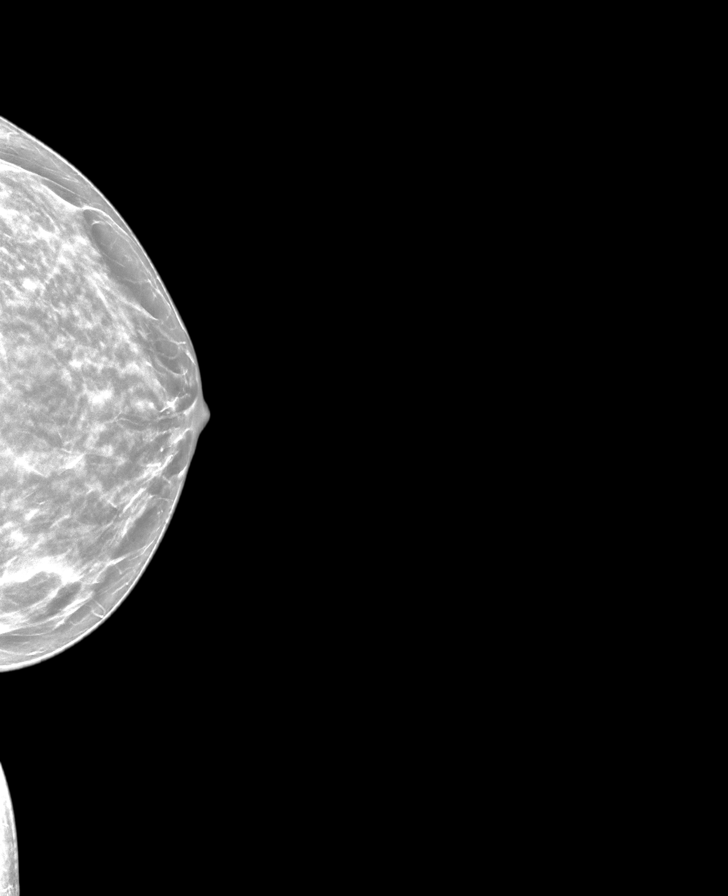

[8 of 28 positions shown; findings below may reference images not displayed]

ACR Breast Density Category c: The breast tissue is heterogeneously
dense, which may obscure small masses.
FINDINGS: There are no findings suspicious for malignancy. Images were
processed with CAD.
IMPRESSION: No mammographic evidence of malignancy. A result letter of this
screening mammogram will be mailed directly to the patient.

RECOMMENDATION:
Screening mammogram in one year. (Code:[5L])

BI-RADS CATEGORY  1:  Negative.

## 2019-08-23 ENCOUNTER — Other Ambulatory Visit: Payer: Self-pay | Admitting: Obstetrics and Gynecology

## 2019-08-23 DIAGNOSIS — Z0000 Encounter for general adult medical examination without abnormal findings: Secondary | ICD-10-CM

## 2019-09-09 ENCOUNTER — Encounter: Payer: Self-pay | Admitting: Orthopaedic Surgery

## 2019-09-09 ENCOUNTER — Other Ambulatory Visit: Payer: Self-pay

## 2019-09-09 ENCOUNTER — Ambulatory Visit: Payer: 59 | Admitting: Orthopaedic Surgery

## 2019-09-09 DIAGNOSIS — M7712 Lateral epicondylitis, left elbow: Secondary | ICD-10-CM

## 2019-09-09 DIAGNOSIS — M65332 Trigger finger, left middle finger: Secondary | ICD-10-CM | POA: Diagnosis not present

## 2019-09-09 MED ORDER — METHYLPREDNISOLONE ACETATE 40 MG/ML IJ SUSP
40.0000 mg | INTRAMUSCULAR | Status: AC | PRN
  Administered 2019-09-09: 40 mg

## 2019-09-09 MED ORDER — LIDOCAINE HCL 1 % IJ SOLN
1.0000 mL | INTRAMUSCULAR | Status: AC | PRN
  Administered 2019-09-09: 1 mL

## 2019-09-09 NOTE — Progress Notes (Signed)
Office Visit Note   Patient: Margaret Finley           Date of Birth: 1965/03/16           MRN: [id number] Visit Date: 09/09/2019              Requested by: Idell Pickles, MD [address] [place],  [place] [id number] PCP: Idell Pickles, MD   Assessment & Plan: Visit Diagnoses:  1. Trigger finger, left middle finger   2. Lateral epicondylitis, left elbow     Plan: I was able to show her stretching exercises for lateral epicondyle area and the elbow.  Also recommend a steroid injection over the lateral epicondyle of the left elbow as well as the trigger finger.  I explained the risk and benefits of injections.  She tolerated these well.  All question concerns were answered and addressed.  Follow-up as needed.  Follow-Up Instructions: Return if symptoms worsen or fail to improve.   Orders:  Orders Placed This Encounter  Procedures  . Hand/UE Inj  . Hand/UE Inj   No orders of the defined types were placed in this encounter.     Procedures: Hand/UE Inj: L long A1 for trigger finger on 09/09/2019 4:10 PM Medications: 1 mL lidocaine 1 %; 40 mg methylPREDNISolone acetate 40 MG/ML  Hand/UE Inj: L elbow for lateral epicondylitis on 09/09/2019 4:10 PM Medications: 1 mL lidocaine 1 %; 40 mg methylPREDNISolone acetate 40 MG/ML      Clinical Data: No additional findings.   Subjective: Chief Complaint  Patient presents with  . Left Elbow - Pain  The patient is a 54 year old right-hand-dominant female who comes in with left elbow pain for years and she points over the lateral epicondyle source of her pain.  She has had injections that have worked in the past with the last one being almost a decade ago.  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  She also has been having locking catching and triggering of her left middle finger.  She denies any numbness and tingling in her hand.  She denies any other injuries.  She tried Voltaren gel in these areas.  She is requesting  injections with steroid.  She has had no acute change in her medical status.  She is not a diabetic.  HPI  Review of Systems She currently denies any headache, chest pain, shortness of breath, fever, chills, nausea, vomiting  Objective: Vital Signs: There were no vitals taken for this visit.  Physical Exam She is alert and orient x3 and in no acute distress Ortho Exam Examination of her left elbow shows there is tenderness over the lateral epicondyle.  There is also active triggering of the middle finger.  There is pain over the A1 pulley of the middle finger.  The remainder of her upper extremity exam is normal. Specialty Comments:  No specialty comments available.  Imaging: No results found.   PMFS History: There are no problems to display for this patient.  Past Medical History:  Diagnosis Date  . Anxiety    situational (when flying on airplanes)  . Arthritis   . Frequent urination    Pt has 3 kidneys  . Gallstones    symptomatic  . GERD (gastroesophageal reflux disease)   . Hypercholesterolemia   . Mixed hyperlipidemia   . Osteopenia   . Pneumonia   . Recurrent UTI   . Vitamin D deficiency   . Wears contact lenses  Family History  Problem Relation Age of Onset  . Diabetes Father   . Heart disease Father     Past Surgical History:  Procedure Laterality Date  . birth control implant     [facility]  . BREAST ENHANCEMENT SURGERY    . CERVICAL ABLATION    . CHOLECYSTECTOMY N/A 06/05/2017   Procedure: LAPAROSCOPIC CHOLECYSTECTOMY ERAS PATHWAY;  Surgeon: Coralie Keens, MD;  Location: [facility];  Service: General;  Laterality: N/A;  . LIPOMA EXCISION Right 06/05/2017   Procedure: EXCISION LIPOMA RIGHT SHOULDER;  Surgeon: Coralie Keens, MD;  Location: [facility];  Service: General;  Laterality: Right;   Social History   Occupational History  . Not on file  Tobacco Use  . Smoking status: Former Smoker    Types: Cigarettes  . Smokeless tobacco: Never Used  .  Tobacco comment: quit smoking cigarettes in 1993  Vaping Use  . Vaping Use: Never used  Substance and Sexual Activity  . Alcohol use: Yes  . Drug use: Never  . Sexual activity: Not on file

## 2019-10-08 ENCOUNTER — Other Ambulatory Visit: Payer: Self-pay | Admitting: Obstetrics and Gynecology

## 2019-10-08 ENCOUNTER — Ambulatory Visit
Admission: RE | Admit: 2019-10-08 | Discharge: 2019-10-08 | Disposition: A | Discharge status: Home / Self Care | Payer: 59 | Source: Ambulatory Visit | Attending: Obstetrics and Gynecology | Admitting: Obstetrics and Gynecology

## 2019-10-08 ENCOUNTER — Other Ambulatory Visit: Payer: Self-pay

## 2019-10-08 DIAGNOSIS — Z0000 Encounter for general adult medical examination without abnormal findings: Secondary | ICD-10-CM

## 2019-10-08 IMAGING — MG DIGITAL SCREENING BREAST BILAT IMPLANT W/ TOMO W/ CAD
8 of 12 series · 8 of 28 positions shown · non-contrast
Comparison: Previous exam(s).

CLINICAL DATA: Screening.

EXAM:
DIGITAL SCREENING BILATERAL MAMMOGRAM WITH IMPLANTS, CAD AND TOMO
The patient has retropectoral implants. Standard and implant
displaced views were performed.

[L MLO]
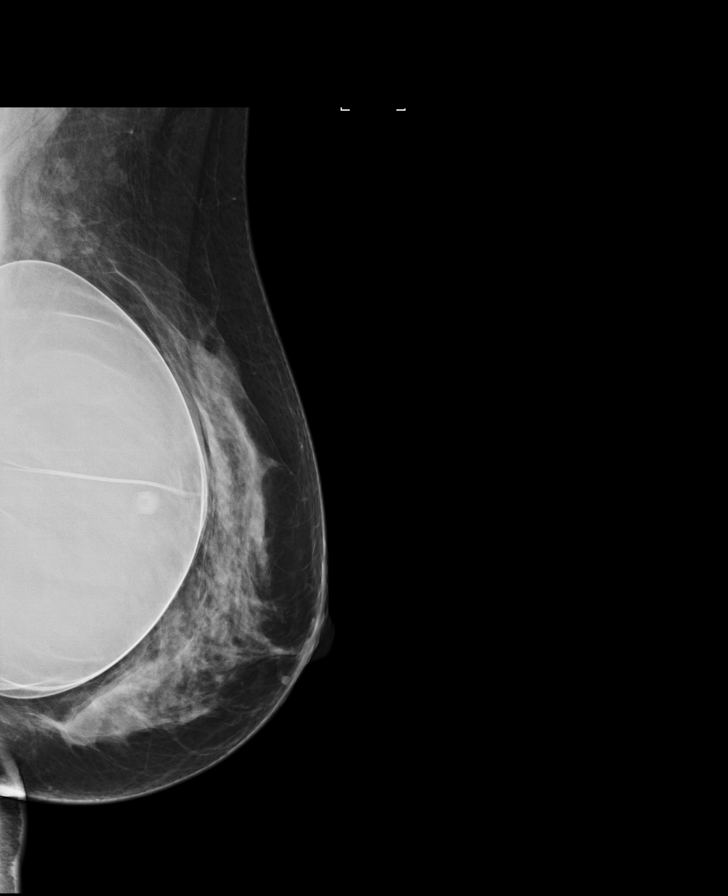

[R CC]
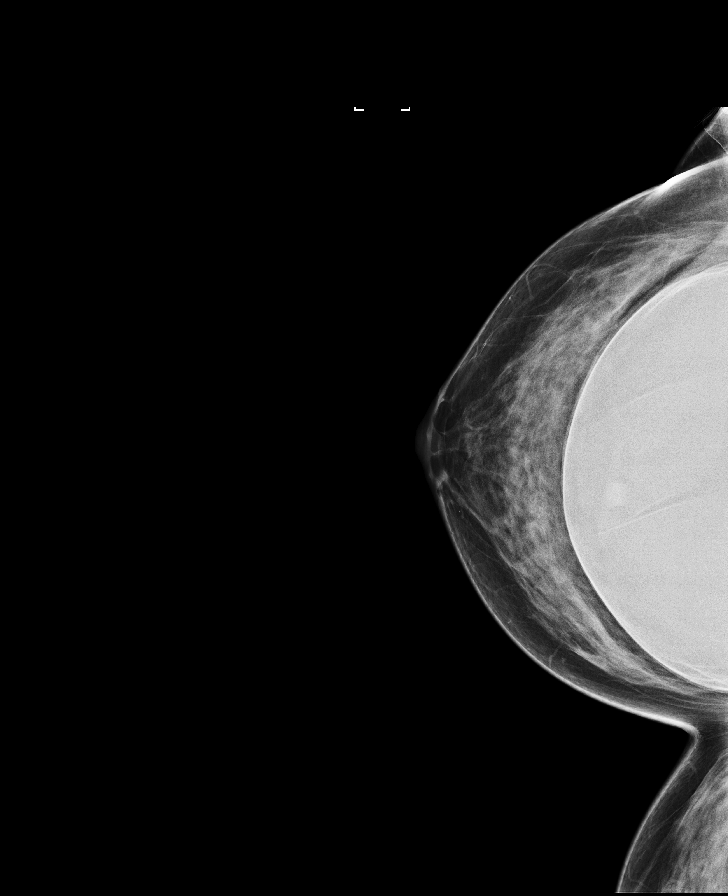

[R MLO]
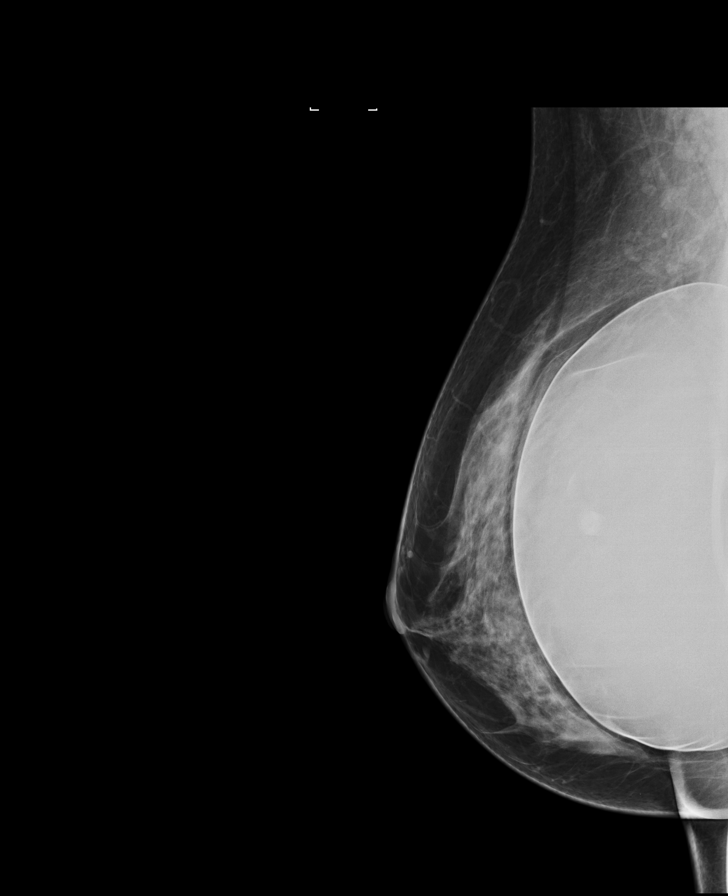

[L CC]
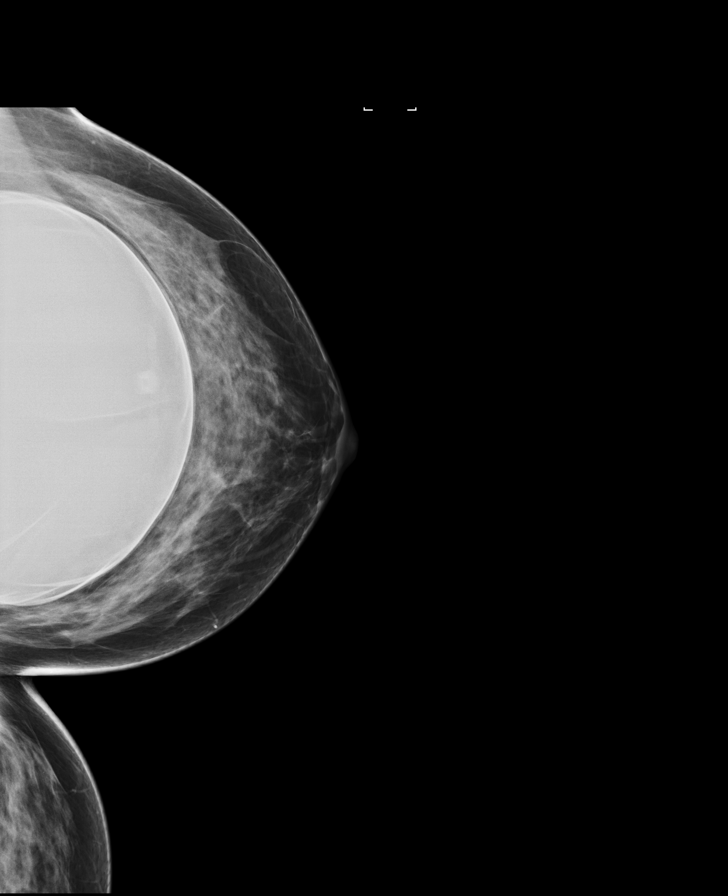

[R MLO synth-2D]
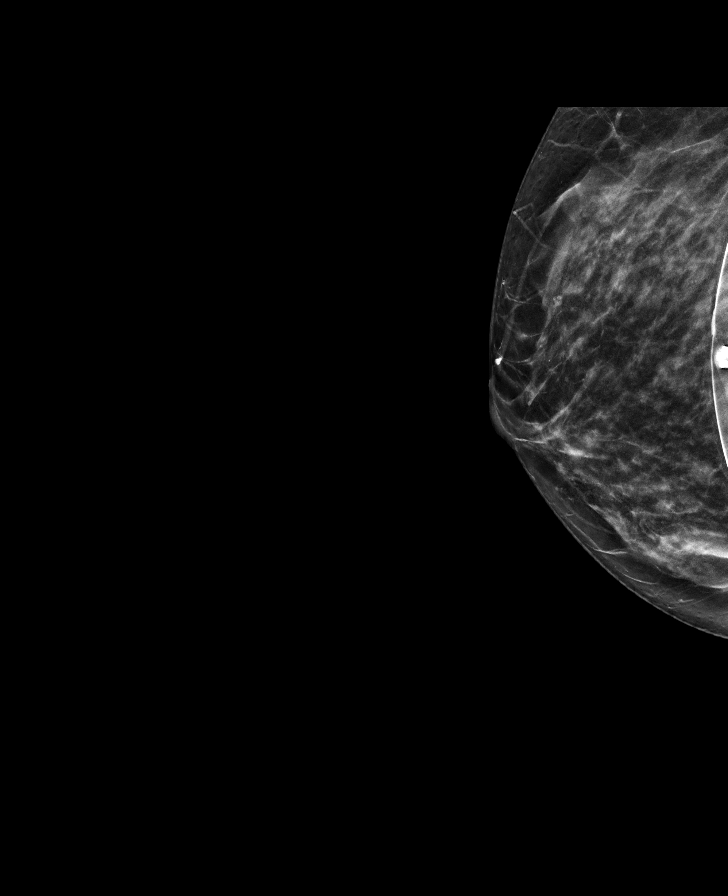

[L MLO synth-2D]
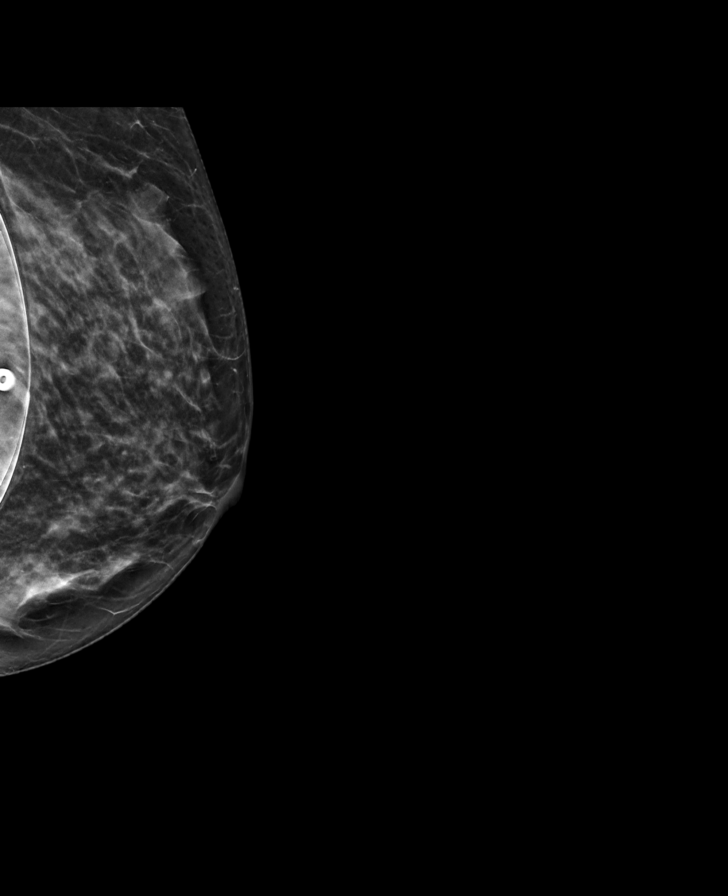

[L CC synth-2D]
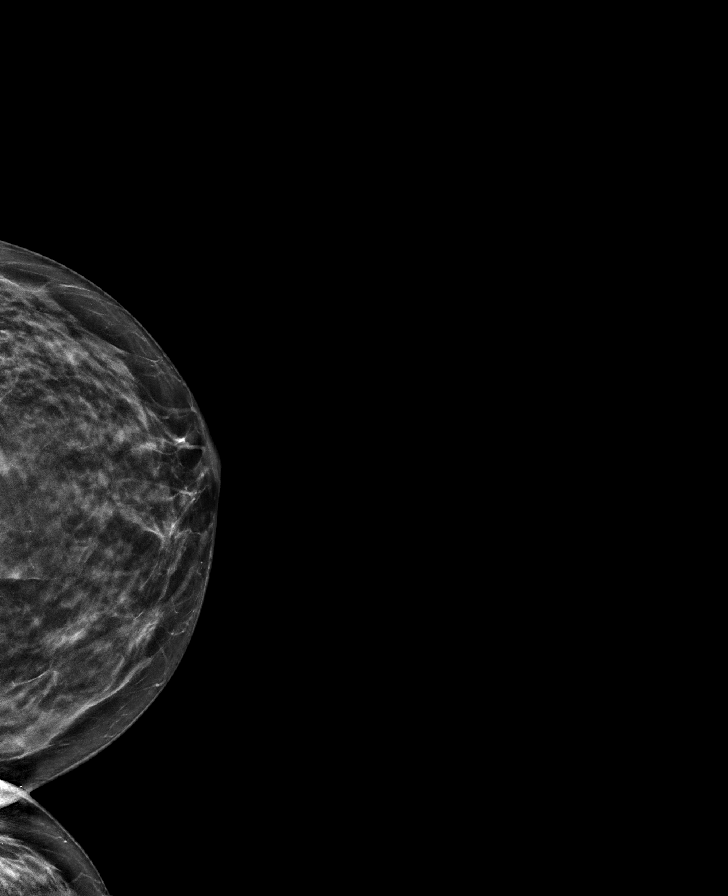

[R CC synth-2D]
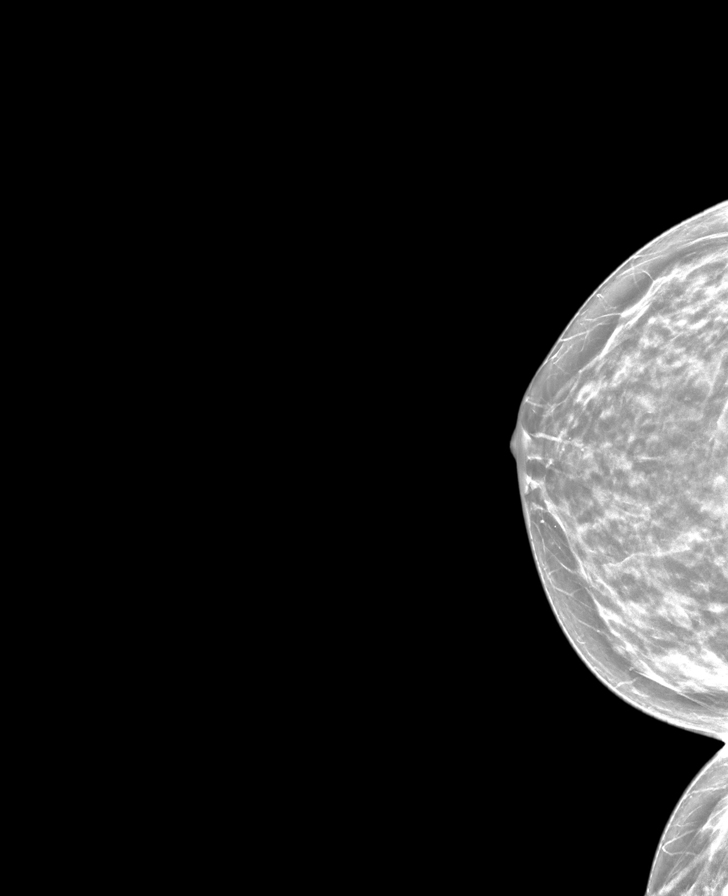

[8 of 28 positions shown; findings below may reference images not displayed]

ACR Breast Density Category c: The breast tissue is heterogeneously
dense, which may obscure small masses.
FINDINGS: There are no findings suspicious for malignancy. Images were
processed with CAD.
IMPRESSION: No mammographic evidence of malignancy. A result letter of this
screening mammogram will be mailed directly to the patient.

RECOMMENDATION:
Screening mammogram in one year. (Code:[5L])

BI-RADS CATEGORY  1:  Negative.

## 2020-08-14 ENCOUNTER — Other Ambulatory Visit: Payer: Self-pay | Admitting: Orthopedic Surgery

## 2020-08-14 DIAGNOSIS — M7711 Lateral epicondylitis, right elbow: Secondary | ICD-10-CM

## 2020-08-20 ENCOUNTER — Other Ambulatory Visit: Payer: Self-pay

## 2020-08-20 ENCOUNTER — Ambulatory Visit
Admission: RE | Admit: 2020-08-20 | Discharge: 2020-08-20 | Disposition: A | Discharge status: Home / Self Care | Payer: BC Managed Care – PPO | Source: Ambulatory Visit | Attending: Orthopedic Surgery | Admitting: Orthopedic Surgery

## 2020-08-20 DIAGNOSIS — M7711 Lateral epicondylitis, right elbow: Secondary | ICD-10-CM

## 2020-08-20 IMAGING — MR MR ELBOW*R* W/O CM
4 of 5 series · 20 of 40 positions shown · non-contrast
Comparison: None.

CLINICAL DATA: Right elbow pain

EXAM:
MRI OF THE RIGHT ELBOW WITHOUT CONTRAST
TECHNIQUE: Multiplanar, multisequence MR imaging of the elbow was performed. No
intravenous contrast was administered.

[Series 3: T1 · axial · 3.5mm · 0.27mm/px · z∈[-63,+14]mm · 3 of 23 slices shown]
[im 3/23]
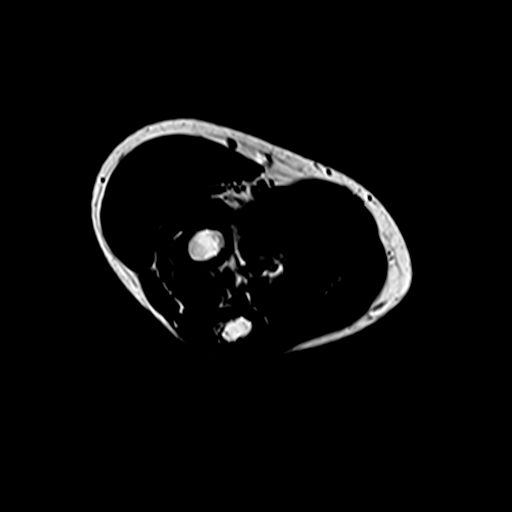
[im 12/23]
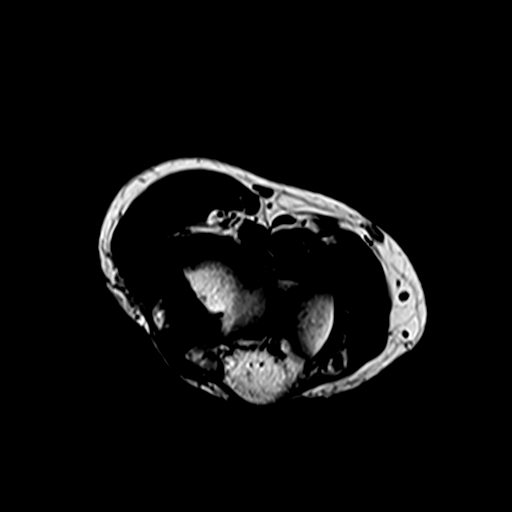
[im 20/23]
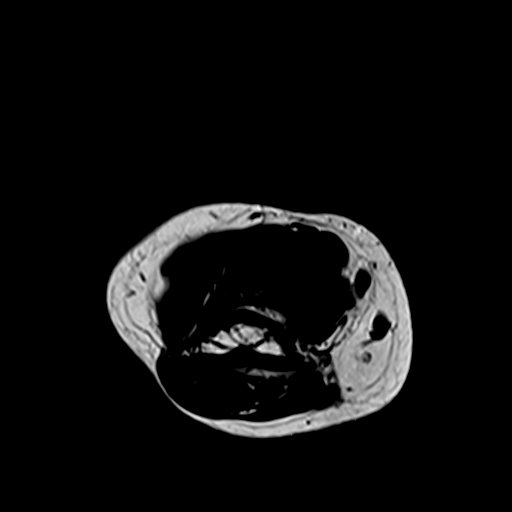

[Series 4: T2 fat-sat · axial · 3.5mm · 0.27mm/px · z∈[-72,+28]mm · 9 of 23 slices shown (1 of 3)]
[im 1/23]
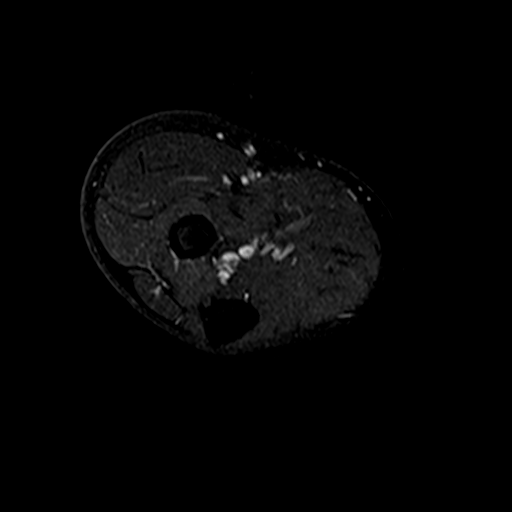
[im 3/23]
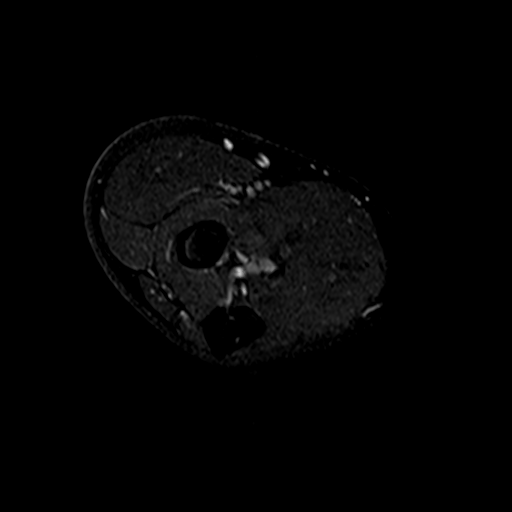
[im 6/23]
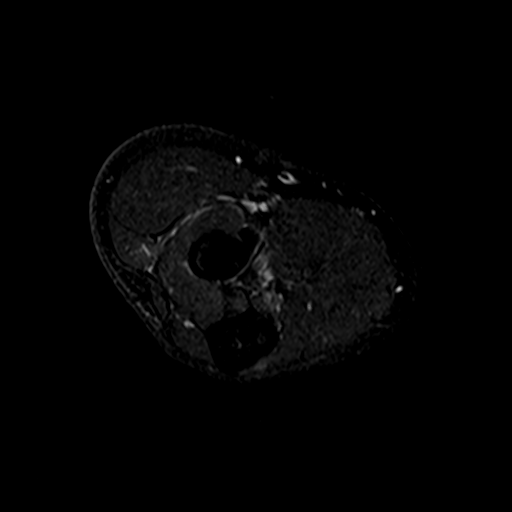
[im 9/23]
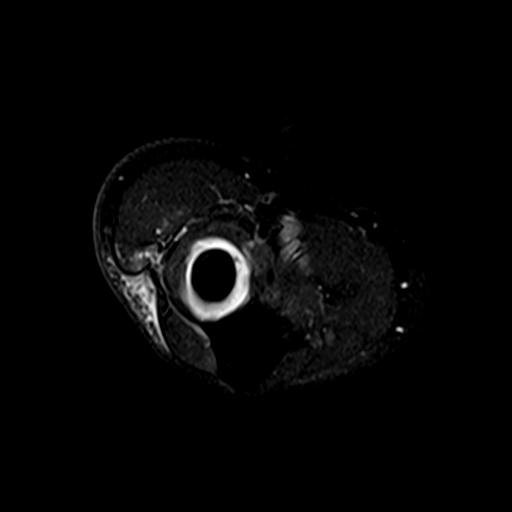
[im 12/23]
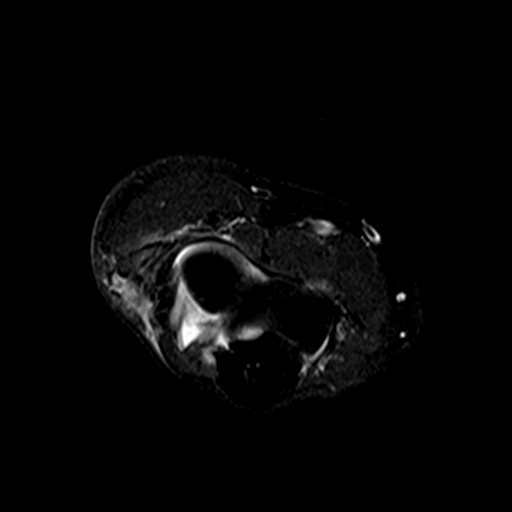
[im 14/23]
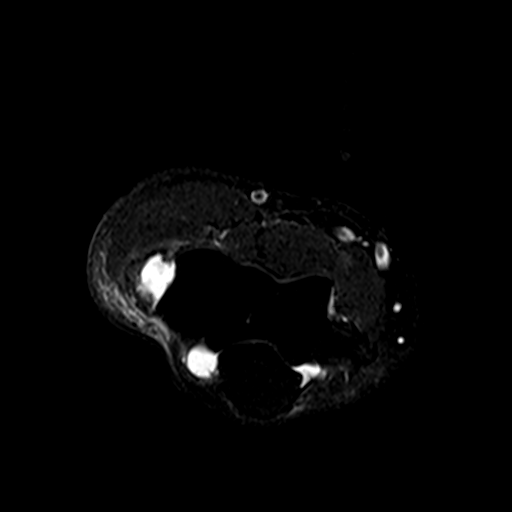
[im 17/23]
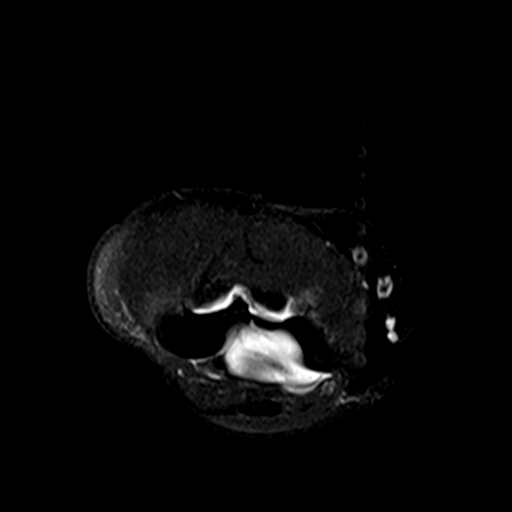
[im 20/23]
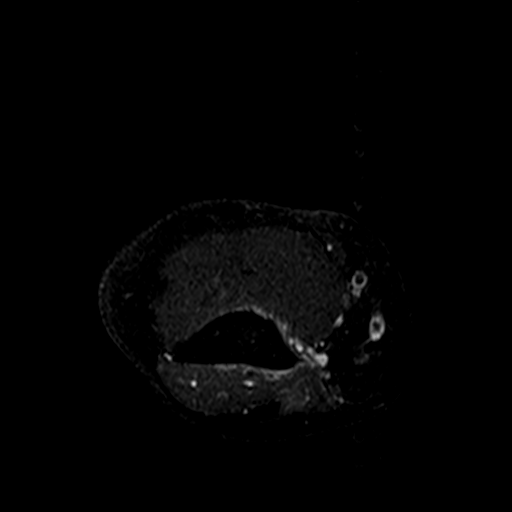
[im 23/23]
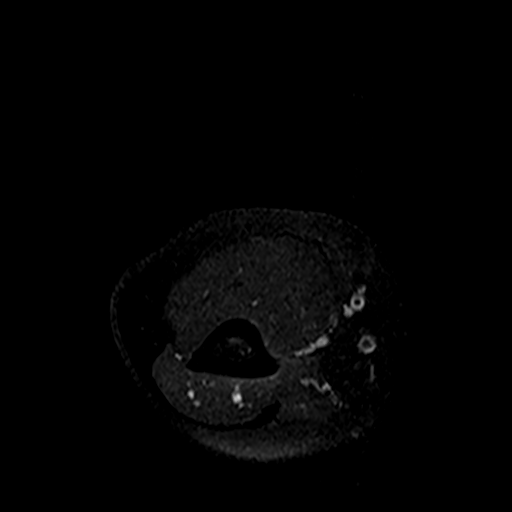

[Series 6: T2 fat-sat · coronal · 3.5mm · 0.27mm/px · 5 of 21 slices shown (2 of 3)]
[im 1/21]
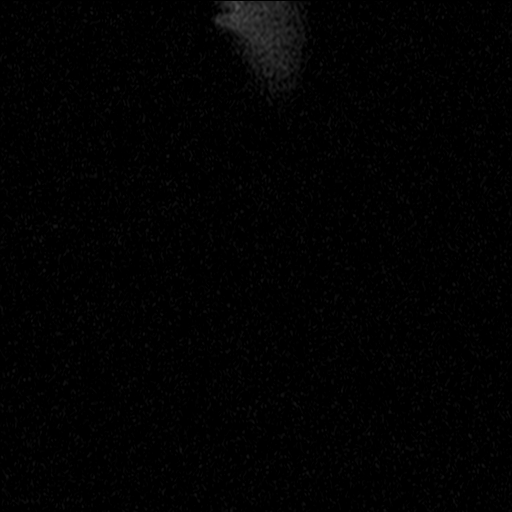
[im 3/21]
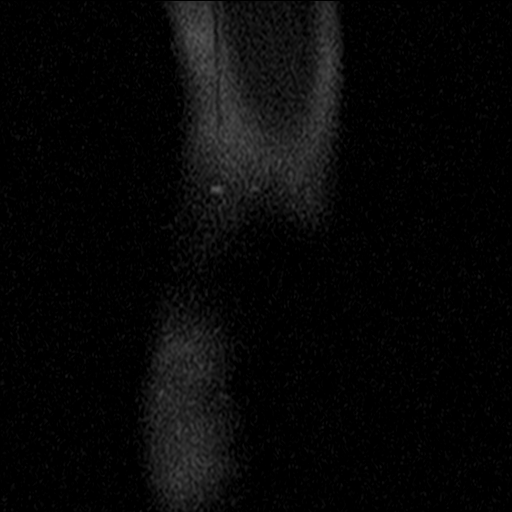
[im 6/21]
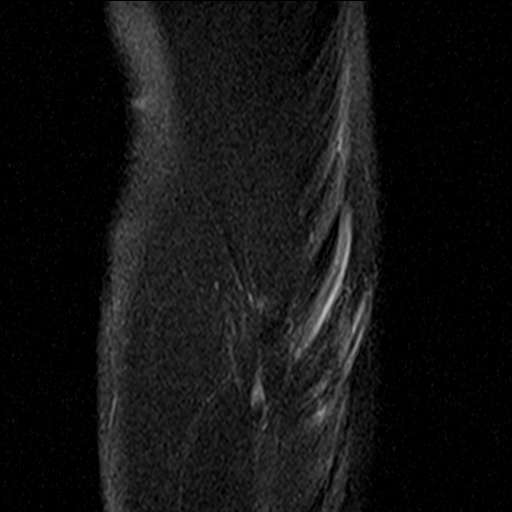
[im 12/21]
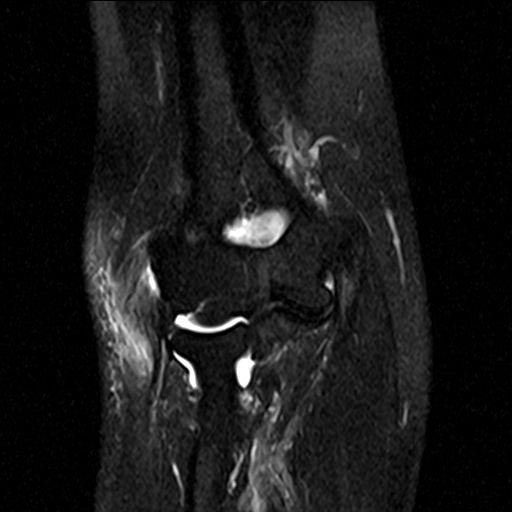
[im 18/21]
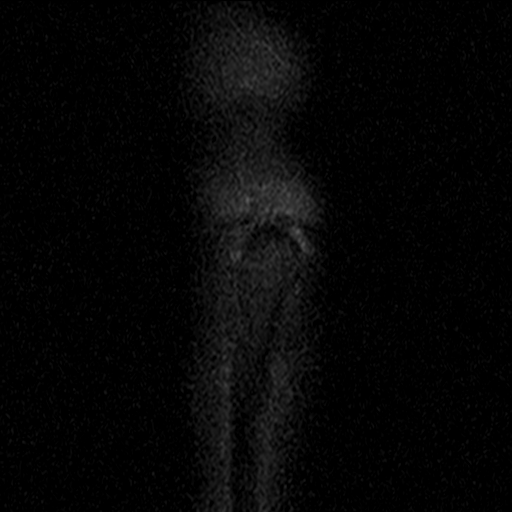

[Series 7: T2 fat-sat · sagittal · 3.5mm · 0.31mm/px · 3 of 17 slices shown (3 of 3)]
[im 4/17]
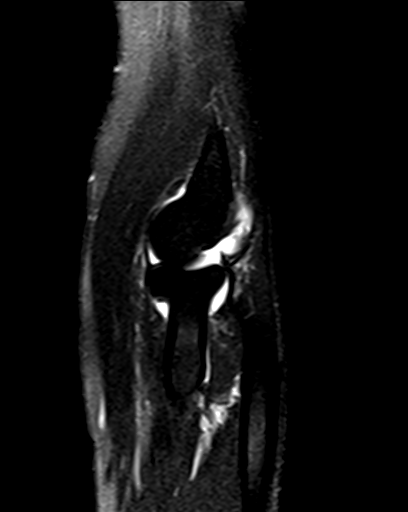
[im 10/17]
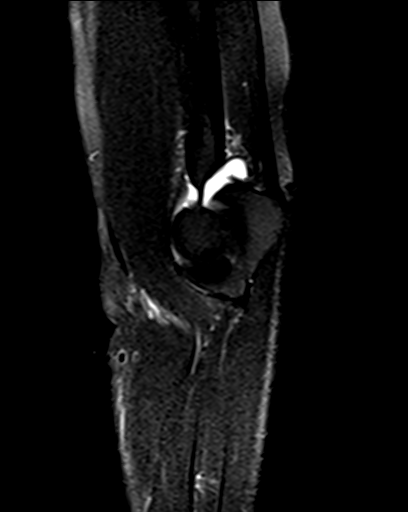
[im 17/17]
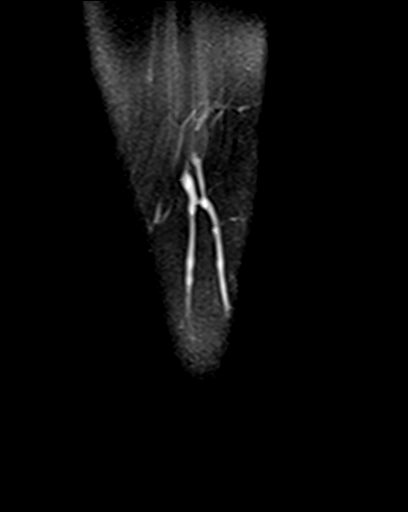

[20 of 40 positions shown; findings below may reference images not displayed]

FINDINGS: TENDONS

Common forearm flexor origin: Intact

Common forearm extensor origin: There is a torn common extensor
tendon with 1.1 cm retraction (coronal T2 image 11). Adjacent edema
and reactive edema in the adjacent muscles.

Biceps: Intact

Triceps: Intact

LIGAMENTS

Medial stabilizers: Intact

Lateral stabilizers: Torn radial collateral ligament from its
proximal insertion. Possible partial tearing of the lateral ulnar
collateral ligament proximally.

Cartilage: No chondral defect.

Joint: Small joint effusion.

Cubital tunnel: Normal.

Bones: No fracture or dislocation. No marrow abnormality.

Soft Tissues: Reactive muscle edema laterally.
IMPRESSION: Torn common extensor tendon with 1.1 cm retraction. Torn radial
collateral ligament from its proximal insertion. Possible partial
tearing of the proximal lateral ulnar collateral ligament.

Small joint effusion.

## 2020-10-30 ENCOUNTER — Other Ambulatory Visit: Payer: Self-pay | Admitting: Family Medicine

## 2020-10-30 DIAGNOSIS — Z1231 Encounter for screening mammogram for malignant neoplasm of breast: Secondary | ICD-10-CM

## 2020-11-24 ENCOUNTER — Other Ambulatory Visit: Payer: Self-pay

## 2020-11-24 ENCOUNTER — Ambulatory Visit
Admission: RE | Admit: 2020-11-24 | Discharge: 2020-11-24 | Disposition: A | Discharge status: Home / Self Care | Payer: BC Managed Care – PPO | Source: Ambulatory Visit | Attending: Family Medicine | Admitting: Family Medicine

## 2020-11-24 DIAGNOSIS — Z1231 Encounter for screening mammogram for malignant neoplasm of breast: Secondary | ICD-10-CM | POA: Diagnosis not present

## 2020-11-24 IMAGING — MG DIGITAL SCREENING BREAST BILAT IMPLANT W/ TOMO W/ CAD
9 of 12 series · 9 of 28 positions shown · non-contrast
Comparison: Previous exam(s).

CLINICAL DATA: Screening.

EXAM:
DIGITAL SCREENING BILATERAL MAMMOGRAM WITH IMPLANTS, CAD AND
TOMOSYNTHESIS
TECHNIQUE: Bilateral screening digital craniocaudal and mediolateral oblique
mammograms were obtained. Bilateral screening digital breast
tomosynthesis was performed. The images were evaluated with
computer-aided detection. Standard and/or implant displaced views
were performed.

[R CC]
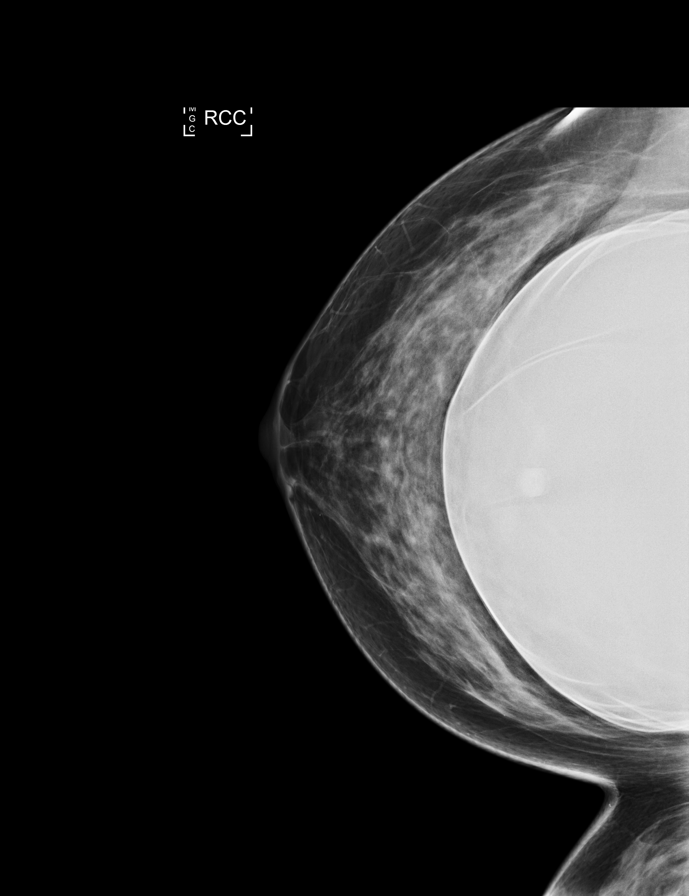

[L MLO]
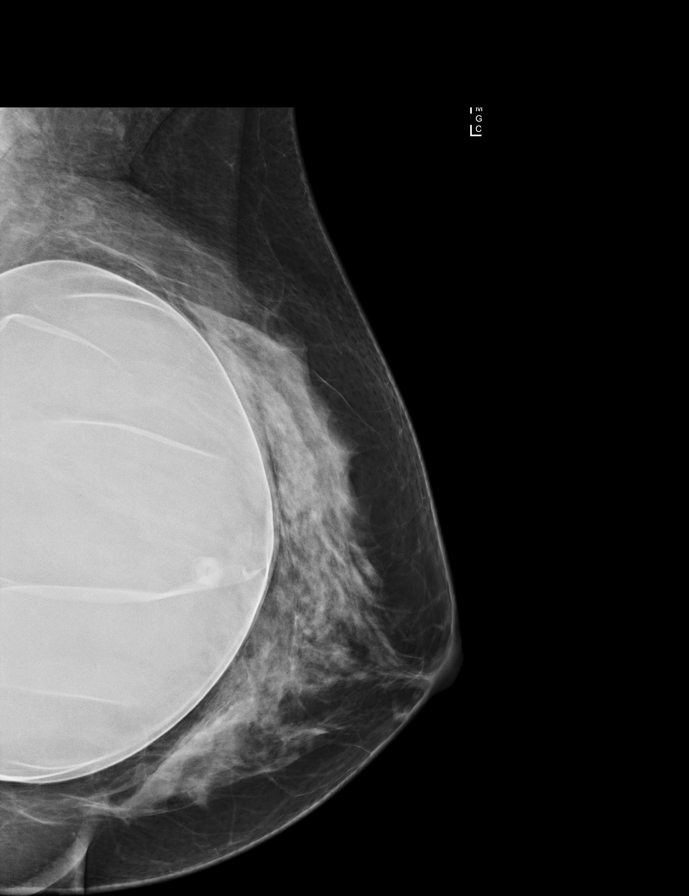

[R MLO]
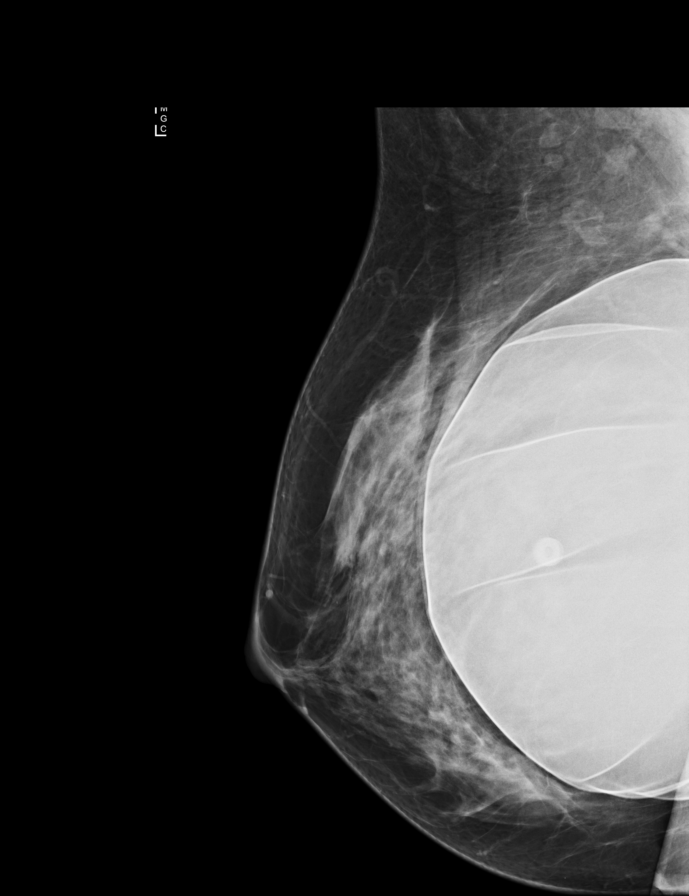

[L CC]
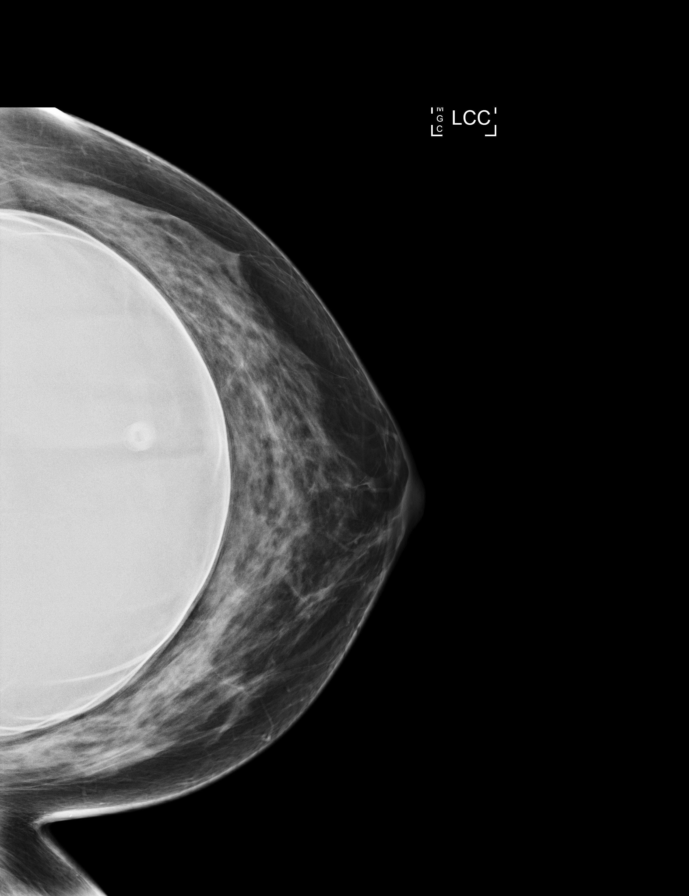

[R MLO synth-2D]
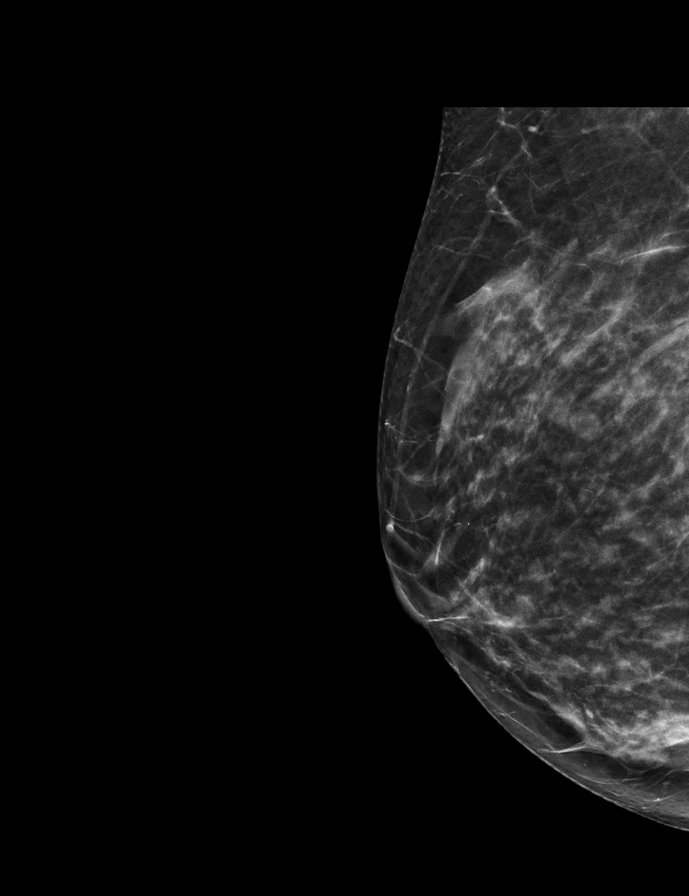

[L MLO synth-2D]
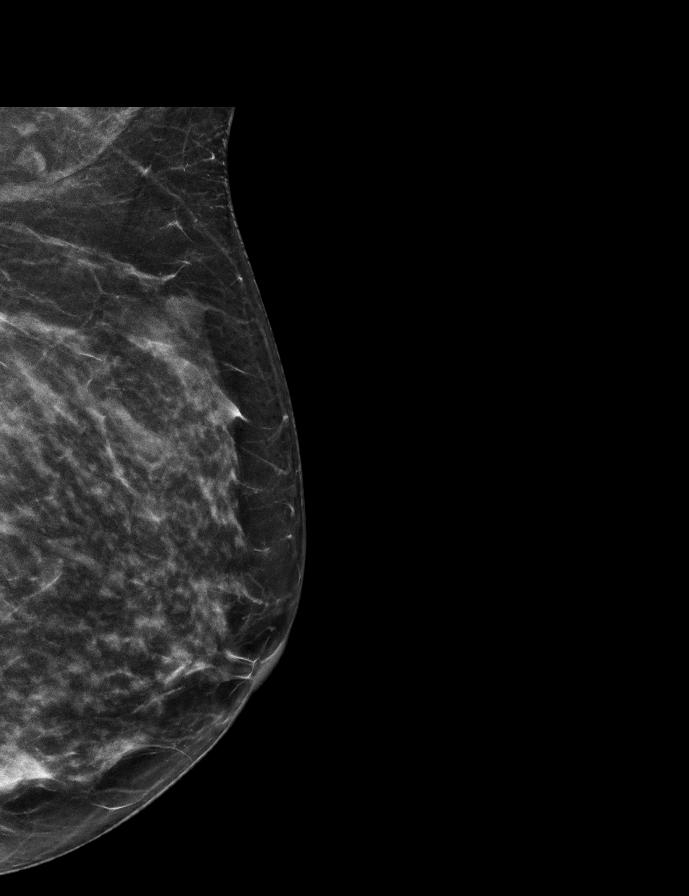

[L CC synth-2D]
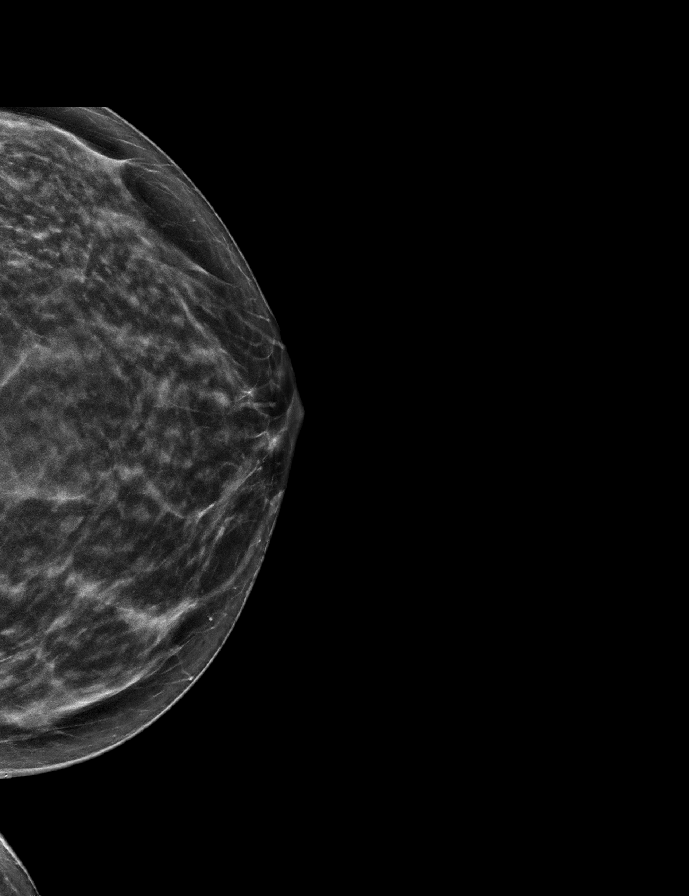

[R CC synth-2D]
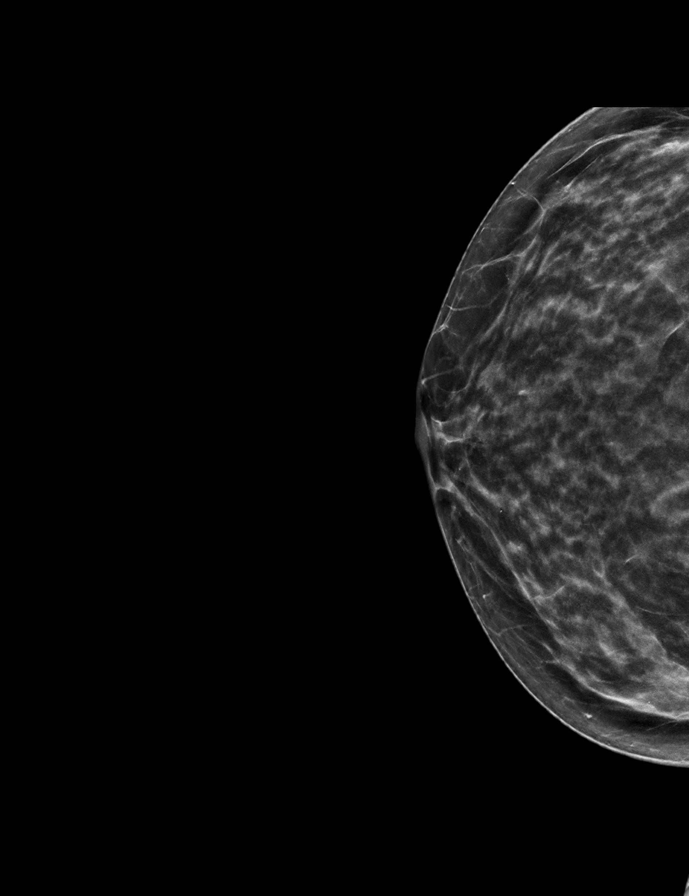

[R CCID BREAST TOMOSYNTHESIS IMAGE tomo · tomo slice 25/50.0]
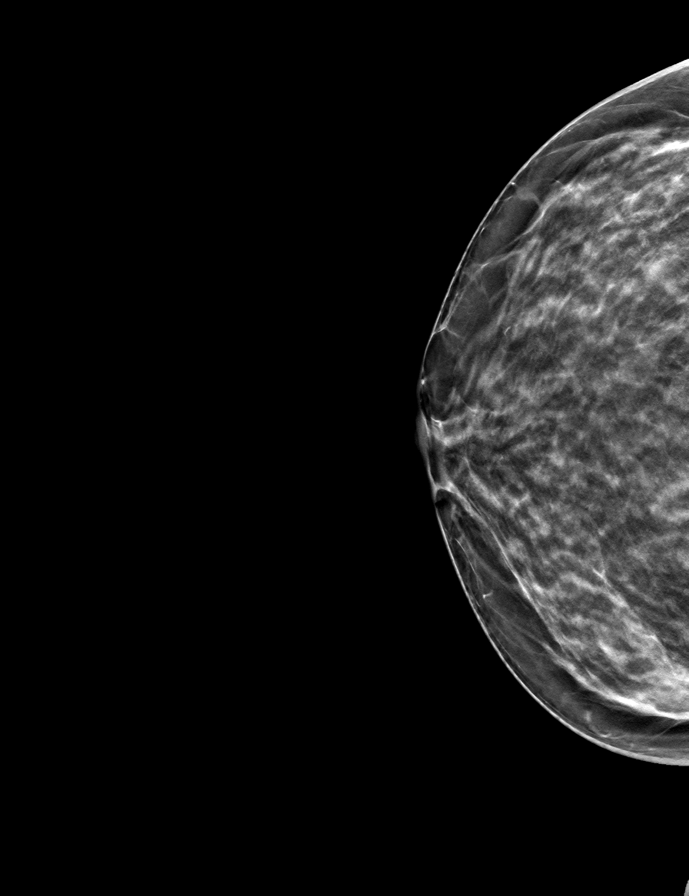

[9 of 28 positions shown; findings below may reference images not displayed]

ACR Breast Density Category c: The breast tissue is heterogeneously
dense, which may obscure small masses.
FINDINGS: The patient has retropectoral implants. There are no findings
suspicious for malignancy.
IMPRESSION: No mammographic evidence of malignancy. A result letter of this
screening mammogram will be mailed directly to the patient.

RECOMMENDATION:
Screening mammogram in one year. (Code:[LZ])

BI-RADS CATEGORY  1:  Negative.

## 2021-02-15 DIAGNOSIS — D2261 Melanocytic nevi of right upper limb, including shoulder: Secondary | ICD-10-CM | POA: Diagnosis not present

## 2021-02-15 DIAGNOSIS — L821 Other seborrheic keratosis: Secondary | ICD-10-CM | POA: Diagnosis not present

## 2021-02-15 DIAGNOSIS — D225 Melanocytic nevi of trunk: Secondary | ICD-10-CM | POA: Diagnosis not present

## 2021-02-15 DIAGNOSIS — L814 Other melanin hyperpigmentation: Secondary | ICD-10-CM | POA: Diagnosis not present

## 2021-02-21 DIAGNOSIS — M7712 Lateral epicondylitis, left elbow: Secondary | ICD-10-CM | POA: Diagnosis not present

## 2021-04-18 ENCOUNTER — Other Ambulatory Visit: Payer: Self-pay | Admitting: Orthopaedic Surgery

## 2021-04-18 DIAGNOSIS — M7711 Lateral epicondylitis, right elbow: Secondary | ICD-10-CM

## 2021-05-01 ENCOUNTER — Inpatient Hospital Stay: Admission: RE | Admit: 2021-05-01 | Payer: BC Managed Care – PPO | Source: Ambulatory Visit

## 2021-05-01 ENCOUNTER — Other Ambulatory Visit: Payer: Self-pay

## 2021-05-01 ENCOUNTER — Ambulatory Visit
Admission: RE | Admit: 2021-05-01 | Discharge: 2021-05-01 | Disposition: A | Discharge status: Home / Self Care | Payer: BC Managed Care – PPO | Source: Ambulatory Visit | Attending: Orthopaedic Surgery | Admitting: Orthopaedic Surgery

## 2021-05-01 DIAGNOSIS — R531 Weakness: Secondary | ICD-10-CM | POA: Diagnosis not present

## 2021-05-01 DIAGNOSIS — M7711 Lateral epicondylitis, right elbow: Secondary | ICD-10-CM

## 2021-05-01 DIAGNOSIS — M7989 Other specified soft tissue disorders: Secondary | ICD-10-CM | POA: Diagnosis not present

## 2021-05-01 DIAGNOSIS — M25521 Pain in right elbow: Secondary | ICD-10-CM | POA: Diagnosis not present

## 2021-05-01 IMAGING — MR MR ELBOW*R* W/O CM
4 of 5 series · 23 of 40 positions shown · non-contrast
Comparison: None.

CLINICAL DATA: Right elbow pain, swelling and weakness

EXAM:
MRI OF THE RIGHT ELBOW WITHOUT CONTRAST
TECHNIQUE: Multiplanar, multisequence MR imaging of the elbow was performed. No
intravenous contrast was administered.

[Series 3: T1 · axial · 3.5mm · 0.27mm/px · z∈[-54,+49]mm · 3 of 30 slices shown]
[im 4/30]
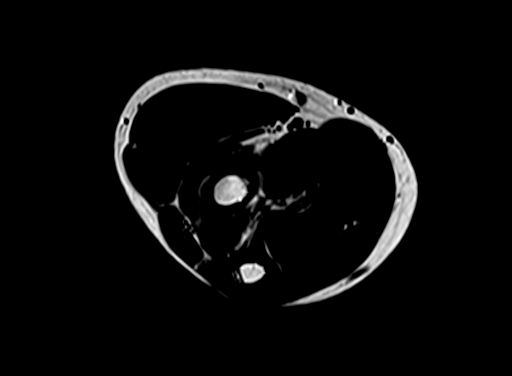
[im 17/30]
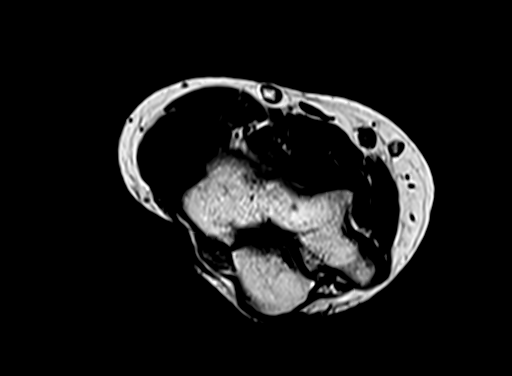
[im 26/30]
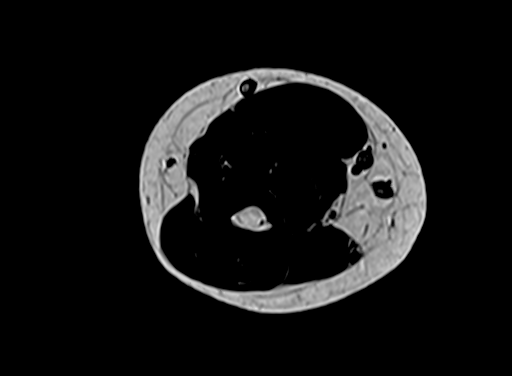

[Series 4: T2 fat-sat · axial · 3.5mm · 0.27mm/px · z∈[-68,+68]mm · 8 of 30 slices shown (1 of 3)]
[im 1/30]
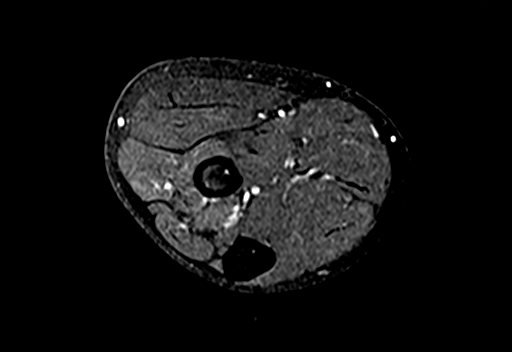
[im 4/30]
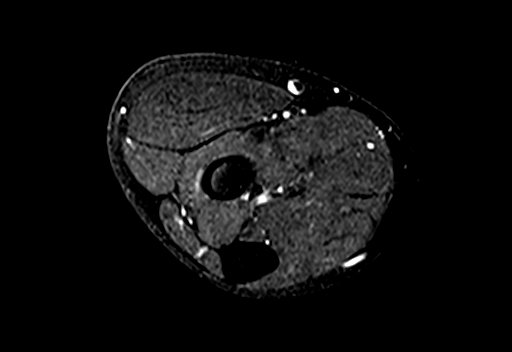
[im 10/30]
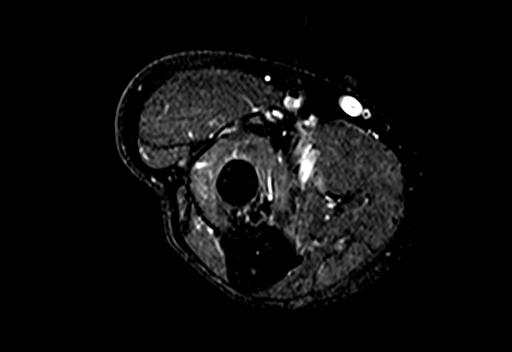
[im 13/30]
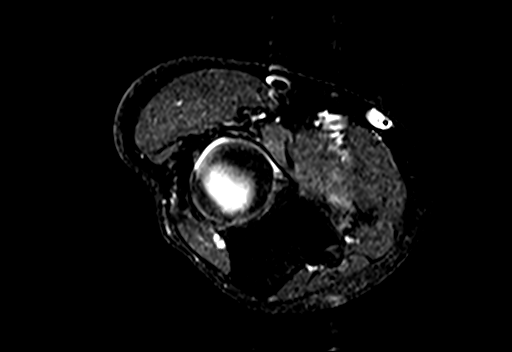
[im 17/30]
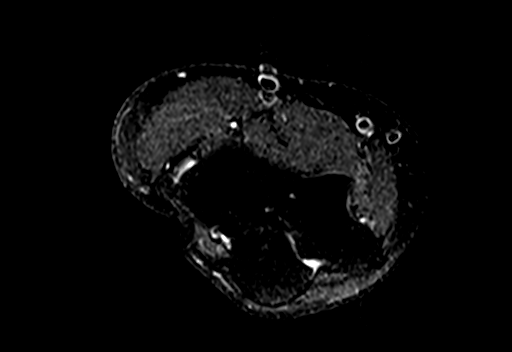
[im 20/30]
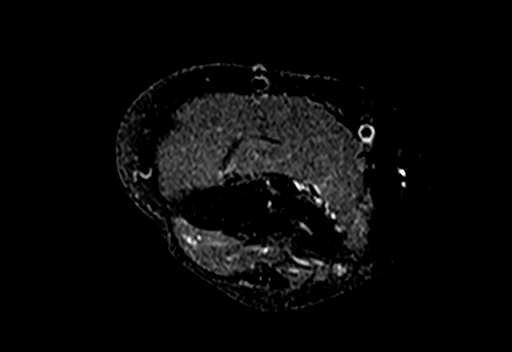
[im 26/30]
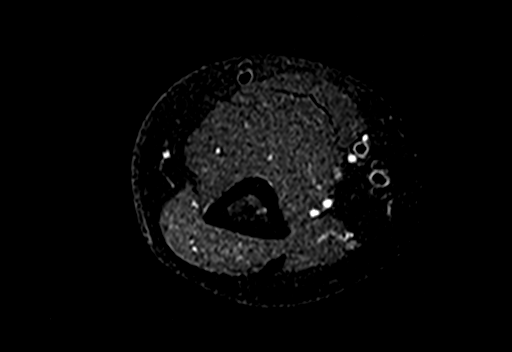
[im 30/30]
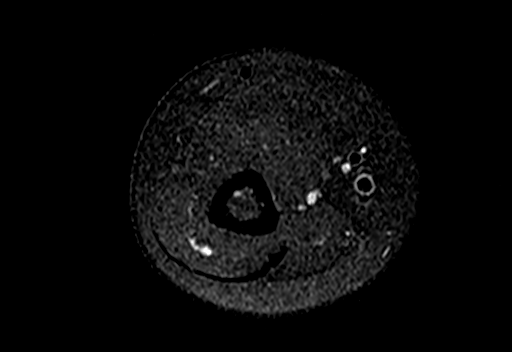

[Series 6: T2 fat-sat · coronal · 3.5mm · 0.31mm/px · 6 of 18 slices shown (2 of 3)]
[im 1/18]
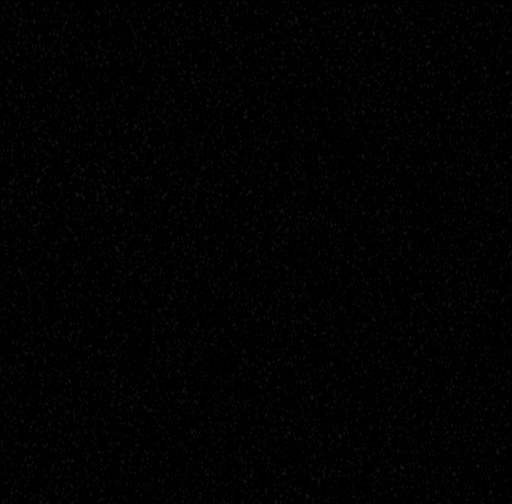
[im 4/18]
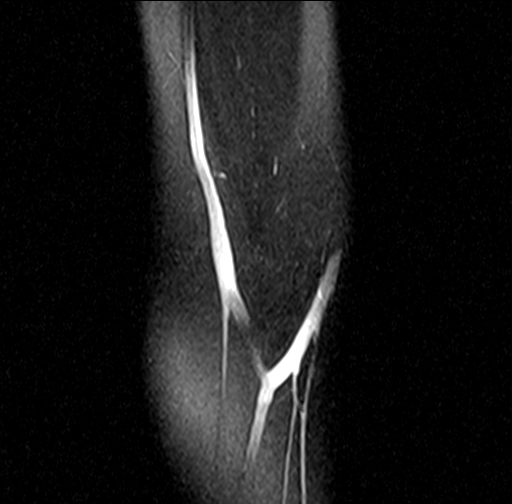
[im 7/18]
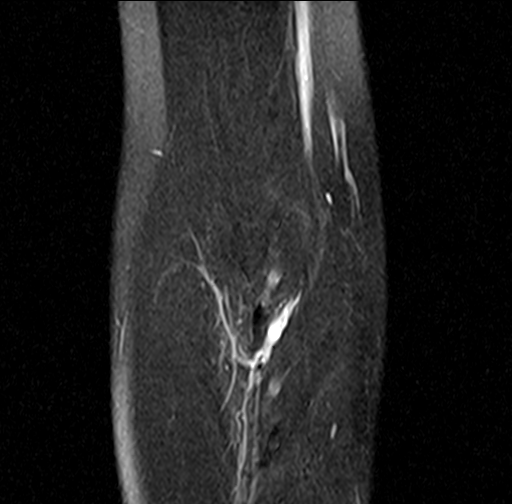
[im 11/18]
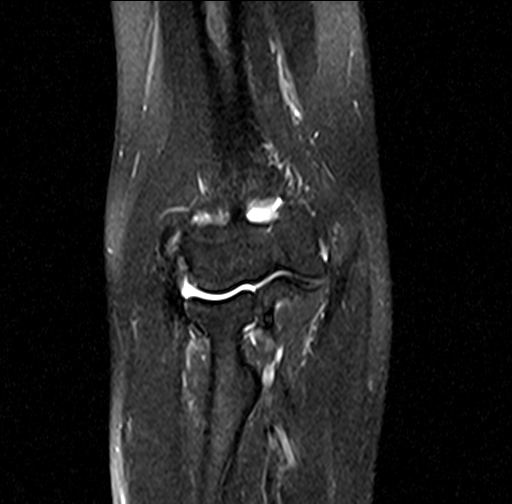
[im 14/18]
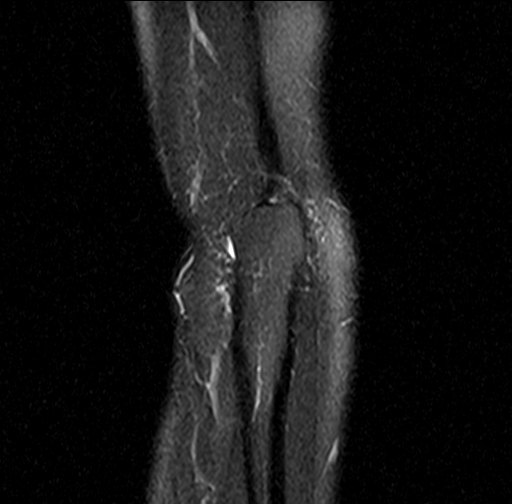
[im 18/18]
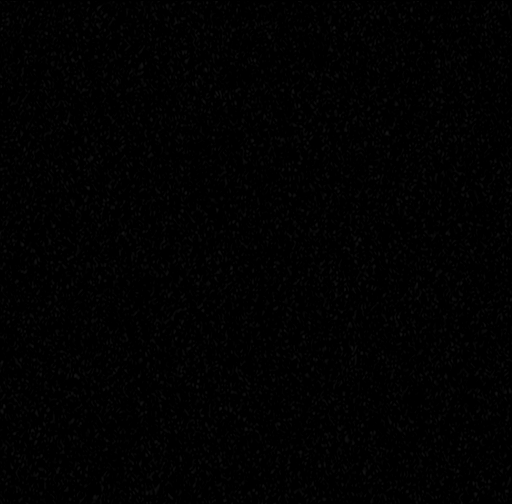

[Series 9: T2 fat-sat · sagittal · 3.5mm · 0.31mm/px · 6 of 23 slices shown (3 of 3)]
[im 1/23]
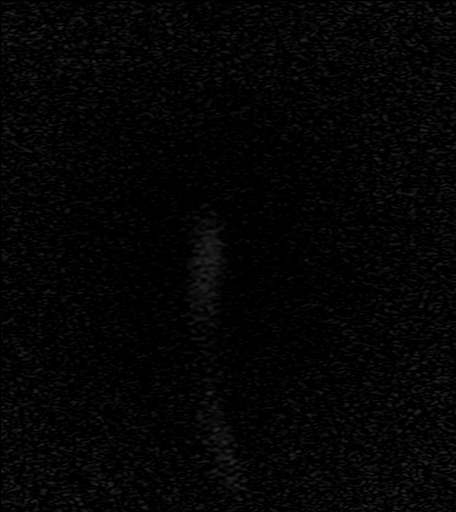
[im 4/23]
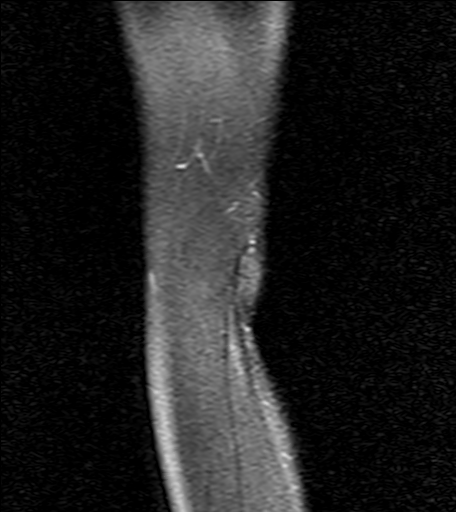
[im 8/23]
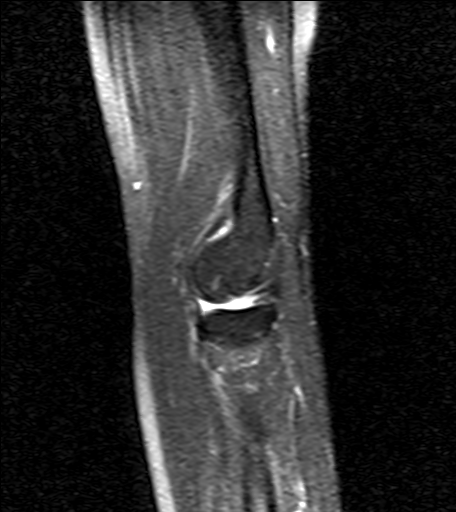
[im 12/23]
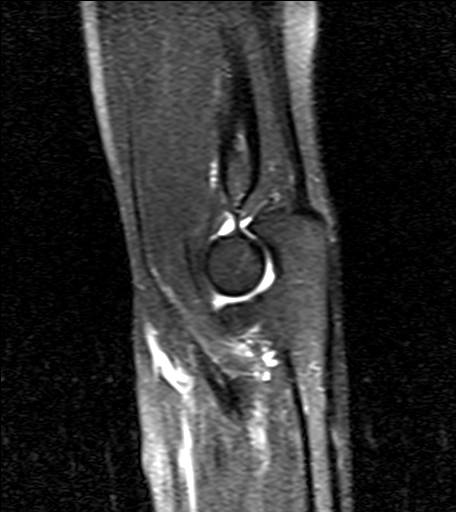
[im 15/23]
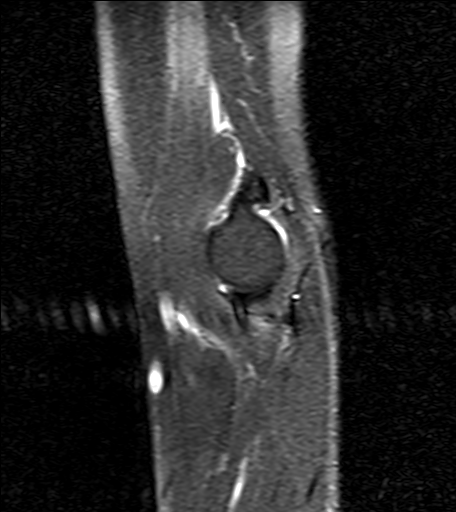
[im 19/23]
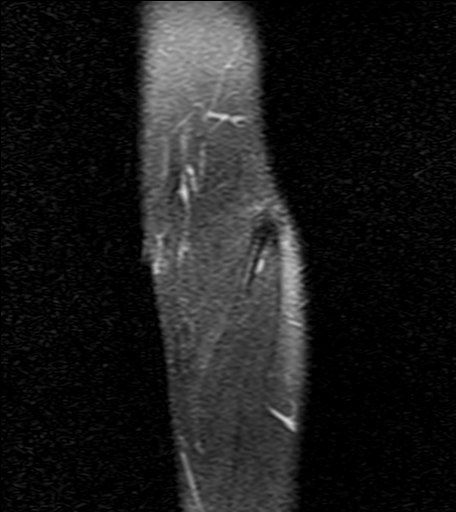

[23 of 40 positions shown; findings below may reference images not displayed]

FINDINGS: TENDONS

Common forearm flexor origin: Intact

Common forearm extensor origin: Postsurgical changes common extensor
tendon repair. There is increased interstitial and articular sided
signal at the origin on the lateral epicondyle but the repair
appears mostly intact.

Biceps: Intact

Triceps: Intact

LIGAMENTS

Medial stabilizers: Intact

Lateral stabilizers: Postsurgical changes of radial collateral and
lateral ulnar collateral ligament repair. The repair appears intact.

Cartilage: No chondral defect.

Joint: No significant joint effusion.

Cubital tunnel: Normal.

Bones: No acute fracture. No significant marrow signal abnormality.
The radiocapitellar line is slightly posterior to the center of the
capitellum. There is mild widening of the radiocapitellar joint
space posteriorly. This is unchanged from prior exam.

Soft Tissues: Muscles are normal. No fluid collection or hematoma.
IMPRESSION: Postsurgical changes of common extensor tendon and lateral
ligamentous repair. There is mildly increased interstitial an
articular sided signal at the origin of the common extensor tendon
at the lateral epicondyle, likely representing postsurgical change
and/or mild tendinosis. There is no recurrent high-grade or
retracted tendon tear. Lateral ligamentous repair appears intact,
though there is mild radiocapitellar joint space widening similar to
prior exam, which can be seen in the setting of posterolateral
rotatory instability, correlate with exam.

## 2021-05-09 DIAGNOSIS — M25521 Pain in right elbow: Secondary | ICD-10-CM | POA: Diagnosis not present

## 2021-05-24 DIAGNOSIS — Z7689 Persons encountering health services in other specified circumstances: Secondary | ICD-10-CM | POA: Diagnosis not present

## 2021-05-24 DIAGNOSIS — E782 Mixed hyperlipidemia: Secondary | ICD-10-CM | POA: Diagnosis not present

## 2021-05-24 DIAGNOSIS — Z01419 Encounter for gynecological examination (general) (routine) without abnormal findings: Secondary | ICD-10-CM | POA: Diagnosis not present

## 2021-07-31 DIAGNOSIS — R351 Nocturia: Secondary | ICD-10-CM | POA: Diagnosis not present

## 2021-07-31 DIAGNOSIS — N3941 Urge incontinence: Secondary | ICD-10-CM | POA: Diagnosis not present

## 2021-07-31 DIAGNOSIS — R3915 Urgency of urination: Secondary | ICD-10-CM | POA: Diagnosis not present

## 2021-07-31 DIAGNOSIS — R350 Frequency of micturition: Secondary | ICD-10-CM | POA: Diagnosis not present

## 2021-11-08 ENCOUNTER — Other Ambulatory Visit: Payer: Self-pay | Admitting: Family Medicine

## 2021-11-08 DIAGNOSIS — Z1231 Encounter for screening mammogram for malignant neoplasm of breast: Secondary | ICD-10-CM

## 2021-11-30 ENCOUNTER — Ambulatory Visit
Admission: RE | Admit: 2021-11-30 | Discharge: 2021-11-30 | Disposition: A | Discharge status: Home / Self Care | Payer: BC Managed Care – PPO | Source: Ambulatory Visit | Attending: Family Medicine | Admitting: Family Medicine

## 2021-11-30 DIAGNOSIS — Z1231 Encounter for screening mammogram for malignant neoplasm of breast: Secondary | ICD-10-CM | POA: Diagnosis not present

## 2022-03-14 DIAGNOSIS — L814 Other melanin hyperpigmentation: Secondary | ICD-10-CM | POA: Diagnosis not present

## 2022-03-14 DIAGNOSIS — D1801 Hemangioma of skin and subcutaneous tissue: Secondary | ICD-10-CM | POA: Diagnosis not present

## 2022-03-14 DIAGNOSIS — D225 Melanocytic nevi of trunk: Secondary | ICD-10-CM | POA: Diagnosis not present

## 2022-03-14 DIAGNOSIS — L821 Other seborrheic keratosis: Secondary | ICD-10-CM | POA: Diagnosis not present

## 2022-03-14 DIAGNOSIS — L80 Vitiligo: Secondary | ICD-10-CM | POA: Diagnosis not present

## 2022-05-30 DIAGNOSIS — Z0000 Encounter for general adult medical examination without abnormal findings: Secondary | ICD-10-CM | POA: Diagnosis not present

## 2022-05-30 DIAGNOSIS — Z1231 Encounter for screening mammogram for malignant neoplasm of breast: Secondary | ICD-10-CM | POA: Diagnosis not present

## 2022-05-30 DIAGNOSIS — Z7689 Persons encountering health services in other specified circumstances: Secondary | ICD-10-CM | POA: Diagnosis not present

## 2022-05-30 DIAGNOSIS — L819 Disorder of pigmentation, unspecified: Secondary | ICD-10-CM | POA: Diagnosis not present

## 2022-05-30 DIAGNOSIS — E782 Mixed hyperlipidemia: Secondary | ICD-10-CM | POA: Diagnosis not present

## 2022-06-03 ENCOUNTER — Other Ambulatory Visit: Payer: Self-pay | Admitting: Family Medicine

## 2022-06-03 DIAGNOSIS — Z1231 Encounter for screening mammogram for malignant neoplasm of breast: Secondary | ICD-10-CM

## 2022-08-08 ENCOUNTER — Other Ambulatory Visit: Payer: Self-pay

## 2022-08-08 ENCOUNTER — Emergency Department (HOSPITAL_COMMUNITY)
Admission: EM | Admit: 2022-08-08 | Discharge: 2022-08-08 | Disposition: A | Discharge status: Home / Self Care | Payer: BC Managed Care – PPO | Attending: Emergency Medicine | Admitting: Emergency Medicine

## 2022-08-08 ENCOUNTER — Encounter (HOSPITAL_COMMUNITY): Payer: Self-pay | Admitting: *Deleted

## 2022-08-08 ENCOUNTER — Emergency Department (HOSPITAL_COMMUNITY): Payer: BC Managed Care – PPO

## 2022-08-08 DIAGNOSIS — Z7982 Long term (current) use of aspirin: Secondary | ICD-10-CM | POA: Insufficient documentation

## 2022-08-08 DIAGNOSIS — R519 Headache, unspecified: Secondary | ICD-10-CM | POA: Insufficient documentation

## 2022-08-08 DIAGNOSIS — I10 Essential (primary) hypertension: Secondary | ICD-10-CM | POA: Diagnosis not present

## 2022-08-08 DIAGNOSIS — E876 Hypokalemia: Secondary | ICD-10-CM | POA: Diagnosis not present

## 2022-08-08 DIAGNOSIS — Z91040 Latex allergy status: Secondary | ICD-10-CM | POA: Diagnosis not present

## 2022-08-08 DIAGNOSIS — R82998 Other abnormal findings in urine: Secondary | ICD-10-CM | POA: Diagnosis not present

## 2022-08-08 LAB — CBC WITH DIFFERENTIAL/PLATELET
Abs Immature Granulocytes: 0.03 10*3/uL (ref 0.00–0.07)
Basophils Absolute: 0 10*3/uL (ref 0.0–0.1)
Basophils Relative: 0 %
Eosinophils Absolute: 0.1 10*3/uL (ref 0.0–0.5)
Eosinophils Relative: 2 %
HCT: 40.4 % (ref 36.0–46.0)
Hemoglobin: 13.7 g/dL (ref 12.0–15.0)
Immature Granulocytes: 0 %
Lymphocytes Relative: 21 %
Lymphs Abs: 1.4 10*3/uL (ref 0.7–4.0)
MCH: 30.4 pg (ref 26.0–34.0)
MCHC: 33.9 g/dL (ref 30.0–36.0)
MCV: 89.8 fL (ref 80.0–100.0)
Monocytes Absolute: 0.1 10*3/uL (ref 0.1–1.0)
Monocytes Relative: 2 %
Neutro Abs: 5.1 10*3/uL (ref 1.7–7.7)
Neutrophils Relative %: 75 %
Platelets: 255 10*3/uL (ref 150–400)
RBC: 4.5 MIL/uL (ref 3.87–5.11)
RDW: 11.9 % (ref 11.5–15.5)
WBC: 6.9 10*3/uL (ref 4.0–10.5)
nRBC: 0 % (ref 0.0–0.2)

## 2022-08-08 LAB — COMPREHENSIVE METABOLIC PANEL
ALT: 26 U/L (ref 0–44)
AST: 25 U/L (ref 15–41)
Albumin: 4.7 g/dL (ref 3.5–5.0)
Alkaline Phosphatase: 52 U/L (ref 38–126)
Anion gap: 10 (ref 5–15)
BUN: 17 mg/dL (ref 6–20)
CO2: 24 mmol/L (ref 22–32)
Calcium: 9.7 mg/dL (ref 8.9–10.3)
Chloride: 105 mmol/L (ref 98–111)
Creatinine, Ser: 0.66 mg/dL (ref 0.44–1.00)
GFR, Estimated: >60 mL/min (ref >60)
Glucose, Bld: 166 mg/dL — ABNORMAL HIGH (ref 70–99)
Potassium: 3.7 mmol/L (ref 3.5–5.1)
Sodium: 139 mmol/L (ref 135–145)
Total Bilirubin: 1.2 mg/dL (ref 0.3–1.2)
Total Protein: 7.8 g/dL (ref 6.5–8.1)

## 2022-08-08 LAB — TROPONIN I (HIGH SENSITIVITY): Troponin I (High Sensitivity): 3 ng/L (ref <18)

## 2022-08-08 LAB — MAGNESIUM: Magnesium: 2.2 mg/dL (ref 1.7–2.4)

## 2022-08-08 MED ORDER — SODIUM CHLORIDE 0.9 % IV BOLUS
1000.0000 mL | Freq: Once | INTRAVENOUS | Status: AC
  Administered 2022-08-08: 1000 mL via INTRAVENOUS

## 2022-08-08 MED ORDER — POTASSIUM CHLORIDE CRYS ER 20 MEQ PO TBCR
40.0000 meq | EXTENDED_RELEASE_TABLET | Freq: Once | ORAL | Status: AC
  Administered 2022-08-08: 40 meq via ORAL
  Filled 2022-08-08: qty 4

## 2022-08-08 MED ORDER — POTASSIUM CHLORIDE 10 MEQ/100ML IV SOLN: 10.0000 meq | Freq: Once | INTRAVENOUS | Status: DC

## 2022-08-08 MED ORDER — POTASSIUM CHLORIDE 10 MEQ/100ML IV SOLN
10.0000 meq | INTRAVENOUS | Status: DC
  Administered 2022-08-08: 10 meq via INTRAVENOUS
  Filled 2022-08-08 (×2): qty 100

## 2022-08-08 NOTE — ED Triage Notes (Signed)
Pt was seen at [facility] for feeling generally unwell today and was found to have a potassium of 2.8 so she was sent to ER for further evaluation.

## 2022-08-08 NOTE — Discharge Instructions (Signed)
As we discussed, your potassium today is 3.7 and 2.8 at urgent care is likely lab error.  Take Tylenol and Motrin for headaches  See your doctor for follow-up  Return to ER if you have worse headache or vomiting or fever

## 2022-08-08 NOTE — ED Provider Notes (Signed)
[place] EMERGENCY DEPARTMENT AT [facility] Provider Note   CSN: [id number] Arrival date & time: 08/08/22  1840     History  Chief Complaint  Patient presents with   Abnormal Lab    K 2.8    Margaret Finley is a 57 y.o. female otherwise healthy here presenting with headaches and hypokalemia.  Patient states that she woke up today with headaches.  Patient states that she did not eat much today.  Patient went to urgent care and had a potassium of 2.8.  Patient states that she has no diarrhea.  Patient was sent here for hypokalemia.  Patient is not on any diuretics.  Patient states that she received migraine cocktail at urgent care and now is feeling better.  The history is provided by the patient.       Home Medications Prior to Admission medications   Medication Sig Start Date End Date Taking? Authorizing Provider  aspirin EC 81 MG tablet Take 81 mg by mouth daily.   Yes [provider]  ciprofloxacin (CIPRO) 500 MG tablet Take 500 mg by mouth 2 (two) times daily as needed (as directed for UTI(s) or bladder infections).   Yes [provider]  rosuvastatin (CRESTOR) 20 MG tablet Take 20 mg by mouth daily.   Yes [provider]  Cholecalciferol (VITAMIN D3 PO) Take 1 capsule by mouth daily.    [provider]  Cyanocobalamin (VITAMIN B-12 PO) Take 1 tablet by mouth daily.    [provider]  diazepam (VALIUM) 10 MG tablet Take 10 mg by mouth daily as needed for anxiety (for flying).    [provider]  oxyCODONE (OXY IR/ROXICODONE) 5 MG immediate release tablet Take 1-2 tablets (5-10 mg total) by mouth every 6 (six) hours as needed for moderate pain, severe pain or breakthrough pain. 06/05/17   Margaret Miyamoto, MD  Red Yeast Rice 600 MG CAPS Take 1,200 mg by mouth 2 (two) times daily.    [provider]  VITAMIN E PO Take 1 capsule by mouth daily.    [provider]      Allergies    Sulfa antibiotics,  Cat hair extract, Moxifloxacin, Moxifloxacin hcl, and Latex    Review of Systems   Review of Systems  Neurological:  Positive for headaches.  All other systems reviewed and are negative.   Physical Exam Updated Vital Signs BP 135/85   Pulse 70   Temp 98 F (36.7 C) (Oral)   Resp 19   SpO2 96%  Physical Exam Vitals and nursing note reviewed.  Constitutional:      Appearance: Normal appearance.  HENT:     Head: Normocephalic.     Nose: Nose normal.     Mouth/Throat:     Mouth: Mucous membranes are dry.  Eyes:     Extraocular Movements: Extraocular movements intact.     Pupils: Pupils are equal, round, and reactive to light.  Cardiovascular:     Rate and Rhythm: Normal rate and regular rhythm.     Pulses: Normal pulses.     Heart sounds: Normal heart sounds.  Pulmonary:     Effort: Pulmonary effort is normal.     Breath sounds: Normal breath sounds.  Abdominal:     General: Abdomen is flat.     Palpations: Abdomen is soft.  Musculoskeletal:        General: Normal range of motion.     Cervical back: Normal range of motion and neck supple.  Skin:    General: Skin is warm.     Capillary Refill: Capillary refill takes less than 2 seconds.  Neurological:     General: No focal deficit present.     Mental Status: She is alert and oriented to person, place, and time.     Comments: No obvious facial droop.  Patient has normal strength and sensation bilateral arms and legs.  Patient has normal gait  Psychiatric:        Mood and Affect: Mood normal.        Behavior: Behavior normal.     ED Results / Procedures / Treatments   Labs (all labs ordered are listed, but only abnormal results are displayed) Labs Reviewed  COMPREHENSIVE METABOLIC PANEL - Abnormal; Notable for the following components:      Result Value   Glucose, Bld 166 (*)    All other components within normal limits  CBC WITH DIFFERENTIAL/PLATELET  MAGNESIUM  TROPONIN I (HIGH SENSITIVITY)     EKG None  Radiology CT HEAD WO CONTRAST ( )  Result Date: 08/08/2022 CLINICAL DATA:  Sudden onset headaches, initial encounter EXAM: CT HEAD WITHOUT CONTRAST TECHNIQUE: Contiguous axial images were obtained from the base of the skull through the vertex without intravenous contrast. RADIATION DOSE REDUCTION: This exam was performed according to the departmental dose-optimization program which includes automated exposure control, adjustment of the mA and/or kV according to patient size and/or use of iterative reconstruction technique. COMPARISON:  None Available. FINDINGS: Brain: No evidence of acute infarction, hemorrhage, hydrocephalus, extra-axial collection or mass lesion/mass effect. Vascular: No hyperdense vessel or unexpected calcification. Skull: Normal. Negative for fracture or focal lesion. Sinuses/Orbits: No acute finding. Other: None. IMPRESSION: No acute intracranial abnormality noted. Electronically Signed   By: Alcide Clever M.D.   On: 08/08/2022 21:42    Procedures Procedures    Medications Ordered in ED Medications  potassium chloride 10 mEq in 100 mL IVPB (10 mEq Intravenous Not Given 08/08/22 2119)  potassium chloride SA (KLOR-CON M) CR tablet 40 mEq (40 mEq Oral Given 08/08/22 2009)  sodium chloride 0.9 % bolus 1,000 mL (0 mLs Intravenous Stopped 08/08/22 2201)    ED Course/ Medical Decision Making/ A&P                             Medical Decision Making Margaret Finley is a 57 y.o. female here presenting with headache and hypokalemia.  Patient had decreased p.o. intake today likely causing her hypokalemia.  Patient has no vomiting or diarrhea.  Patient's headache is improved with migraine cocktail but given sudden onset headache today, will get CT head to rule out subarachnoid hemorrhage.  Will repeat CBC and CMP and get magnesium level.  Will hydrate and give potassium.  10:00 PM Labs unremarkable.  I think the hypokalemia from earlier is likely lab error.  CT head  unremarkable.  Patient is feeling better.  Stable for discharge.   Problems Addressed: Nonintractable headache, unspecified chronicity pattern, unspecified headache type: acute illness or injury  Amount and/or Complexity of Data Reviewed Labs: ordered. Decision-making details documented in ED Course. Radiology: ordered and independent interpretation performed. Decision-making details documented in ED Course.  Risk Prescription drug management.    Final Clinical Impression(s) / ED Diagnoses Final diagnoses:  Nonintractable headache, unspecified chronicity pattern, unspecified headache type    Rx / DC Orders ED Discharge Orders     None         Silverio Lay,  Gonzella Lex, MD 08/08/22 2202

## 2022-10-03 DIAGNOSIS — M8580 Other specified disorders of bone density and structure, unspecified site: Secondary | ICD-10-CM | POA: Diagnosis not present

## 2022-10-03 DIAGNOSIS — R739 Hyperglycemia, unspecified: Secondary | ICD-10-CM | POA: Diagnosis not present

## 2022-12-06 ENCOUNTER — Ambulatory Visit: Payer: BC Managed Care – PPO

## 2022-12-24 ENCOUNTER — Ambulatory Visit
Admission: RE | Admit: 2022-12-24 | Discharge: 2022-12-24 | Disposition: A | Discharge status: Home / Self Care | Payer: BC Managed Care – PPO | Source: Ambulatory Visit | Attending: Family Medicine | Admitting: Family Medicine

## 2022-12-24 DIAGNOSIS — Z1231 Encounter for screening mammogram for malignant neoplasm of breast: Secondary | ICD-10-CM

## 2023-02-04 ENCOUNTER — Other Ambulatory Visit: Payer: Self-pay | Admitting: Internal Medicine

## 2023-02-04 DIAGNOSIS — Z1231 Encounter for screening mammogram for malignant neoplasm of breast: Secondary | ICD-10-CM

## 2023-04-18 DIAGNOSIS — E785 Hyperlipidemia, unspecified: Secondary | ICD-10-CM | POA: Diagnosis not present

## 2023-04-22 DIAGNOSIS — R82998 Other abnormal findings in urine: Secondary | ICD-10-CM | POA: Diagnosis not present

## 2023-04-22 DIAGNOSIS — Z0000 Encounter for general adult medical examination without abnormal findings: Secondary | ICD-10-CM | POA: Diagnosis not present

## 2023-04-22 DIAGNOSIS — Z1339 Encounter for screening examination for other mental health and behavioral disorders: Secondary | ICD-10-CM | POA: Diagnosis not present

## 2023-04-22 DIAGNOSIS — Z1331 Encounter for screening for depression: Secondary | ICD-10-CM | POA: Diagnosis not present

## 2023-04-22 DIAGNOSIS — J302 Other seasonal allergic rhinitis: Secondary | ICD-10-CM | POA: Diagnosis not present

## 2023-06-06 DIAGNOSIS — M722 Plantar fascial fibromatosis: Secondary | ICD-10-CM | POA: Diagnosis not present

## 2023-08-28 DIAGNOSIS — M7711 Lateral epicondylitis, right elbow: Secondary | ICD-10-CM | POA: Diagnosis not present

## 2023-08-28 DIAGNOSIS — M67441 Ganglion, right hand: Secondary | ICD-10-CM | POA: Diagnosis not present

## 2023-12-02 DIAGNOSIS — L80 Vitiligo: Secondary | ICD-10-CM | POA: Diagnosis not present

## 2023-12-02 DIAGNOSIS — D225 Melanocytic nevi of trunk: Secondary | ICD-10-CM | POA: Diagnosis not present

## 2023-12-02 DIAGNOSIS — L814 Other melanin hyperpigmentation: Secondary | ICD-10-CM | POA: Diagnosis not present

## 2023-12-15 DIAGNOSIS — M67441 Ganglion, right hand: Secondary | ICD-10-CM | POA: Diagnosis not present

## 2023-12-26 ENCOUNTER — Ambulatory Visit: Payer: BC Managed Care – PPO

## 2023-12-30 ENCOUNTER — Ambulatory Visit
Admission: RE | Admit: 2023-12-30 | Discharge: 2023-12-30 | Disposition: A | Source: Ambulatory Visit | Attending: Internal Medicine | Admitting: Internal Medicine

## 2023-12-30 DIAGNOSIS — Z1231 Encounter for screening mammogram for malignant neoplasm of breast: Secondary | ICD-10-CM | POA: Diagnosis not present
